# Patient Record
Sex: Female | Born: 1965 | Race: White | Hispanic: No | Marital: Married | Smoking: Current every day smoker
Health system: Southern US, Community
[De-identification: ages and names within clinical notes are randomized; demographics above are authoritative.]

## PROBLEM LIST (undated history)

## (undated) DIAGNOSIS — G4730 Sleep apnea, unspecified: Secondary | ICD-10-CM

## (undated) DIAGNOSIS — J449 Chronic obstructive pulmonary disease, unspecified: Secondary | ICD-10-CM

## (undated) DIAGNOSIS — R7303 Prediabetes: Secondary | ICD-10-CM

## (undated) DIAGNOSIS — E785 Hyperlipidemia, unspecified: Secondary | ICD-10-CM

## (undated) DIAGNOSIS — K219 Gastro-esophageal reflux disease without esophagitis: Secondary | ICD-10-CM

## (undated) DIAGNOSIS — I10 Essential (primary) hypertension: Secondary | ICD-10-CM

## (undated) DIAGNOSIS — E039 Hypothyroidism, unspecified: Secondary | ICD-10-CM

## (undated) DIAGNOSIS — G8929 Other chronic pain: Secondary | ICD-10-CM

## (undated) DIAGNOSIS — J439 Emphysema, unspecified: Secondary | ICD-10-CM

## (undated) DIAGNOSIS — M549 Dorsalgia, unspecified: Secondary | ICD-10-CM

## (undated) HISTORY — PX: CARPAL TUNNEL RELEASE: SHX101

## (undated) HISTORY — DX: Chronic obstructive pulmonary disease, unspecified: J44.9

## (undated) HISTORY — PX: WISDOM TOOTH EXTRACTION: SHX21

## (undated) HISTORY — DX: Emphysema, unspecified: J43.9

## (undated) HISTORY — DX: Hyperlipidemia, unspecified: E78.5

---

## 1999-11-28 ENCOUNTER — Other Ambulatory Visit: Admission: RE | Admit: 1999-11-28 | Discharge: 1999-11-28 | Payer: Self-pay | Admitting: Family Medicine

## 2002-02-02 ENCOUNTER — Other Ambulatory Visit: Admission: RE | Admit: 2002-02-02 | Discharge: 2002-02-02 | Payer: Self-pay | Admitting: Family Medicine

## 2003-12-27 ENCOUNTER — Other Ambulatory Visit: Admission: RE | Admit: 2003-12-27 | Discharge: 2003-12-27 | Payer: Self-pay | Admitting: Obstetrics and Gynecology

## 2004-01-02 ENCOUNTER — Encounter: Admission: RE | Admit: 2004-01-02 | Discharge: 2004-01-02 | Payer: Self-pay | Admitting: Obstetrics & Gynecology

## 2004-07-04 ENCOUNTER — Encounter: Admission: RE | Admit: 2004-07-04 | Discharge: 2004-07-04 | Payer: Self-pay | Admitting: Obstetrics & Gynecology

## 2005-01-06 ENCOUNTER — Other Ambulatory Visit: Admission: RE | Admit: 2005-01-06 | Discharge: 2005-01-06 | Payer: Self-pay | Admitting: Obstetrics & Gynecology

## 2005-01-08 ENCOUNTER — Encounter: Admission: RE | Admit: 2005-01-08 | Discharge: 2005-01-08 | Payer: Self-pay | Admitting: Obstetrics & Gynecology

## 2007-02-18 ENCOUNTER — Encounter: Admission: RE | Admit: 2007-02-18 | Discharge: 2007-02-18 | Payer: Self-pay | Admitting: Obstetrics and Gynecology

## 2008-03-20 ENCOUNTER — Encounter: Admission: RE | Admit: 2008-03-20 | Discharge: 2008-03-20 | Payer: Self-pay | Admitting: Obstetrics & Gynecology

## 2009-09-14 ENCOUNTER — Ambulatory Visit: Payer: Self-pay | Admitting: Diagnostic Radiology

## 2009-09-14 ENCOUNTER — Emergency Department (HOSPITAL_BASED_OUTPATIENT_CLINIC_OR_DEPARTMENT_OTHER): Admission: EM | Admit: 2009-09-14 | Discharge: 2009-09-14 | Payer: Self-pay | Admitting: Emergency Medicine

## 2010-04-03 ENCOUNTER — Encounter: Admission: RE | Admit: 2010-04-03 | Discharge: 2010-04-03 | Payer: Self-pay | Admitting: Obstetrics & Gynecology

## 2010-07-07 ENCOUNTER — Encounter: Payer: Self-pay | Admitting: Obstetrics & Gynecology

## 2010-09-04 LAB — CBC
MCHC: 34.7 g/dL (ref 30.0–36.0)
MCV: 87.9 fL (ref 78.0–100.0)
Platelets: 319 10*3/uL (ref 150–400)
RDW: 11.8 % (ref 11.5–15.5)

## 2010-09-04 LAB — DIFFERENTIAL
Eosinophils Absolute: 0.2 10*3/uL (ref 0.0–0.7)
Eosinophils Relative: 2 % (ref 0–5)
Lymphocytes Relative: 31 % (ref 12–46)
Lymphs Abs: 3.6 10*3/uL (ref 0.7–4.0)
Monocytes Relative: 7 % (ref 3–12)
Neutrophils Relative %: 58 % (ref 43–77)

## 2010-09-04 LAB — URINALYSIS, ROUTINE W REFLEX MICROSCOPIC
Bilirubin Urine: NEGATIVE
Nitrite: NEGATIVE
Specific Gravity, Urine: 1.006 (ref 1.005–1.030)
pH: 6 (ref 5.0–8.0)

## 2010-09-04 LAB — COMPREHENSIVE METABOLIC PANEL
ALT: 40 U/L — ABNORMAL HIGH (ref 0–35)
AST: 33 U/L (ref 0–37)
Calcium: 10 mg/dL (ref 8.4–10.5)
Creatinine, Ser: 0.9 mg/dL (ref 0.4–1.2)
GFR calc Af Amer: >60 mL/min (ref >60)
Sodium: 141 mEq/L (ref 135–145)
Total Protein: 7.5 g/dL (ref 6.0–8.3)

## 2010-09-04 LAB — URINE MICROSCOPIC-ADD ON

## 2010-09-04 LAB — LIPASE, BLOOD: Lipase: 143 U/L (ref 23–300)

## 2011-02-05 ENCOUNTER — Other Ambulatory Visit: Payer: Self-pay | Admitting: Obstetrics & Gynecology

## 2011-02-05 DIAGNOSIS — Z1231 Encounter for screening mammogram for malignant neoplasm of breast: Secondary | ICD-10-CM

## 2011-04-10 ENCOUNTER — Ambulatory Visit
Admission: RE | Admit: 2011-04-10 | Discharge: 2011-04-10 | Disposition: A | Discharge status: Home / Self Care | Payer: 59 | Source: Ambulatory Visit | Attending: Obstetrics & Gynecology | Admitting: Obstetrics & Gynecology

## 2011-04-10 DIAGNOSIS — Z1231 Encounter for screening mammogram for malignant neoplasm of breast: Secondary | ICD-10-CM

## 2013-03-30 ENCOUNTER — Other Ambulatory Visit: Payer: Self-pay

## 2013-03-30 DIAGNOSIS — Z1231 Encounter for screening mammogram for malignant neoplasm of breast: Secondary | ICD-10-CM

## 2013-04-26 ENCOUNTER — Ambulatory Visit
Admission: RE | Admit: 2013-04-26 | Discharge: 2013-04-26 | Disposition: A | Discharge status: Home / Self Care | Payer: 59 | Source: Ambulatory Visit

## 2013-04-26 DIAGNOSIS — Z1231 Encounter for screening mammogram for malignant neoplasm of breast: Secondary | ICD-10-CM

## 2014-11-30 ENCOUNTER — Other Ambulatory Visit: Payer: Self-pay

## 2014-11-30 DIAGNOSIS — Z1231 Encounter for screening mammogram for malignant neoplasm of breast: Secondary | ICD-10-CM

## 2015-03-26 ENCOUNTER — Ambulatory Visit
Admission: RE | Admit: 2015-03-26 | Discharge: 2015-03-26 | Disposition: A | Discharge status: Home / Self Care | Payer: 59 | Source: Ambulatory Visit

## 2015-03-26 DIAGNOSIS — Z1231 Encounter for screening mammogram for malignant neoplasm of breast: Secondary | ICD-10-CM

## 2015-04-17 ENCOUNTER — Ambulatory Visit (INDEPENDENT_AMBULATORY_CARE_PROVIDER_SITE_OTHER): Payer: 59 | Admitting: Podiatry

## 2015-04-17 ENCOUNTER — Ambulatory Visit (INDEPENDENT_AMBULATORY_CARE_PROVIDER_SITE_OTHER): Payer: 59

## 2015-04-17 ENCOUNTER — Encounter: Payer: Self-pay | Admitting: Podiatry

## 2015-04-17 VITALS — BP 110/80 | HR 88 | Resp 16

## 2015-04-17 DIAGNOSIS — M722 Plantar fascial fibromatosis: Secondary | ICD-10-CM | POA: Diagnosis not present

## 2015-04-17 MED ORDER — MELOXICAM 15 MG PO TABS: 15.0000 mg | ORAL_TABLET | Freq: Every day | ORAL | Status: DC

## 2015-04-17 MED ORDER — METHYLPREDNISOLONE 4 MG PO TBPK: ORAL_TABLET | ORAL | Status: DC

## 2015-04-17 NOTE — Patient Instructions (Signed)

## 2015-04-17 NOTE — Progress Notes (Signed)
   Subjective:    Patient ID: Kristin Bender, female    DOB: 1965-08-13, 49 y.o.   MRN: [id number]  HPI: She presents today with a chief complaint of painful stabbing heel pain times the past several months. She states that it really doesn't hurt in the morning but by the end of the day is quite severe. She's tried nothing to alleviate symptoms.    Review of Systems  Musculoskeletal: Positive for gait problem.  All other systems reviewed and are negative.      Objective:   Physical Exam: 49 year old female #stable alert and oriented 3 in no apparent distress. Pulses are strongly palpable. Neurologic sensorium is intact percent was a monofilament. Deep tendon reflexes are intact bilateral and muscle strength is 5 over 5 dorsiflexion plantar flexors and inverters and everters all to the musculature is intact. Orthopedic evaluation and strength all joints distal to the ankle range of motion without crepitation. Cutaneous evaluation demonstrates supple well-hydrated cutis no erythema edema cellulitis drainage or odor. Positive pain on palpation medial calcaneal tubercle of the left heel. Radiographs confirm soft tissue increase in density at the plantar fascial calcaneal insertion site.        Assessment & Plan:  Assessment: Plantar fasciitis left.  Plan: Discussed etiology pathology conservative versus surgical therapies started her on a Medrol Dosepak to be followed by meloxicam. Injected the left heel today with log and local anesthetic. I placed her in a plantar fascial brace and the night splint. We discussed appropriate shoe gear stretching exercises ice therapy issue here modifications. I will follow-up with her in 1 month.  Roselind Messier DPM

## 2015-04-24 ENCOUNTER — Ambulatory Visit: Payer: 59 | Admitting: Podiatry

## 2015-05-16 ENCOUNTER — Institutional Professional Consult (permissible substitution): Payer: 59 | Admitting: Pulmonary Disease

## 2015-05-22 ENCOUNTER — Encounter: Payer: Self-pay | Admitting: Podiatry

## 2015-05-22 ENCOUNTER — Ambulatory Visit (INDEPENDENT_AMBULATORY_CARE_PROVIDER_SITE_OTHER): Payer: 59 | Admitting: Podiatry

## 2015-05-22 ENCOUNTER — Telehealth: Payer: Self-pay | Admitting: Pulmonary Disease

## 2015-05-22 ENCOUNTER — Telehealth: Payer: Self-pay | Admitting: *Deleted

## 2015-05-22 VITALS — BP 118/66 | HR 71 | Resp 16

## 2015-05-22 DIAGNOSIS — M722 Plantar fascial fibromatosis: Secondary | ICD-10-CM | POA: Diagnosis not present

## 2015-05-22 NOTE — Progress Notes (Signed)
She presents today for follow-up of plantar fasciitis to her left heel. She states she only takes the medication when she needs it.  Objective: Vital signs stable alert and oriented 3. Pulses are strongly palpable. Minimal pain on palpation medial calcaneal tubercle of the left heel.  Assessment: Plantar fasciitis left heel.  Plan: Continue all conservative therapies medication braces night splint shoe gear changes and follow-up with me in 1 month.

## 2015-05-22 NOTE — Telephone Encounter (Signed)
-----   Message from Glean Hess, [place] sent at 05/18/2015  9:24 AM EST ----- Regarding: APPT LIST Patient needs OV when RA opens clinic per RA

## 2015-05-22 NOTE — Telephone Encounter (Signed)
See TE dated 05/22/15.. Closing encounter

## 2015-05-22 NOTE — Telephone Encounter (Signed)
Patient scheduled to see RA tomorrow at 2:45pm. Patient aware of appointment. Nothing further needed. Closing encounter

## 2015-05-22 NOTE — Telephone Encounter (Signed)
Sharyn Lull have you called this patient?

## 2015-05-22 NOTE — Telephone Encounter (Signed)
Attempted to contact patient to schedule appointment off of Reminder List. Left message for patient to call back.

## 2015-05-23 ENCOUNTER — Encounter: Payer: Self-pay | Admitting: Pulmonary Disease

## 2015-05-23 ENCOUNTER — Ambulatory Visit (INDEPENDENT_AMBULATORY_CARE_PROVIDER_SITE_OTHER): Payer: 59 | Admitting: Pulmonary Disease

## 2015-05-23 VITALS — BP 100/64 | HR 82 | Ht 62.5 in | Wt 255.2 lb

## 2015-05-23 DIAGNOSIS — G4733 Obstructive sleep apnea (adult) (pediatric): Secondary | ICD-10-CM

## 2015-05-23 DIAGNOSIS — F17200 Nicotine dependence, unspecified, uncomplicated: Secondary | ICD-10-CM

## 2015-05-23 MED ORDER — VARENICLINE TARTRATE 0.5 MG PO TABS: 0.5000 mg | ORAL_TABLET | Freq: Two times a day (BID) | ORAL | Status: DC

## 2015-05-23 NOTE — Progress Notes (Signed)
   Subjective:    Patient ID: Kristin Bender, female    DOB: 13-Oct-1965, 49 y.o.   MRN: [id number]  HPI  Chief Complaint  Patient presents with  . Sleep Consult    Referred by Juanda Chance, NP; snoring; witnessed apnea; falling asleep at work Epworth Score: 45   49 year old obese woman presents for evaluation of sleep-disordered breathing. She reported excessive daytime fatigue and was told by her PCP that she had depression. She went on a cruise with her friend who noted loud snoring and witnessed apneas. She reports excessive daytime somnolence at work. Epworth sleepiness score is 13 Bedtime is 11 PM, sleep latency is minimal, she starts was to be on her stomach but rolls over on her back, uses one pillow, reports 2-4 nocturnal awakenings including nocturia and is out of bed at 6 AM feeling tired with dryness of mouth and occasional mild headache.  She's gained 25 pounds in the last 2 years. She reports drinking 2 cups of coffee a day and sleeps with a can of Pepsi at her bedside  She smokes about a pack per day She works a Network engineer job at [employer] She lives with her husband, no bed partner sleep history is available There is no history suggestive of cataplexy, sleep paralysis or parasomnias    No past medical history on file. Hypertension, hypothyroid No past surgical history on file.  Allergies  Allergen Reactions  . Penicillins Anaphylaxis  . Calamine   . Prozac [Fluoxetine]   . Tetracyclines & Related     Social History   Social History  . Marital Status: Divorced    Spouse Name: N/A  . Number of Children: N/A  . Years of Education: N/A   Occupational History  . Not on file.   Social History Main Topics  . Smoking status: Current Every Day Smoker  . Smokeless tobacco: Not on file  . Alcohol Use: No  . Drug Use: Not on file  . Sexual Activity: Not on file   Other Topics Concern  . Not on file   Social History Narrative    No family  history on file.  Review of Systems  Constitutional: Negative for fever, chills and unexpected weight change.  HENT: Negative for congestion, dental problem, ear pain, nosebleeds, postnasal drip, rhinorrhea, sinus pressure, sneezing, sore throat, trouble swallowing and voice change.   Eyes: Negative for visual disturbance.  Respiratory: Negative for cough, choking and shortness of breath.   Cardiovascular: Negative for chest pain and leg swelling.  Gastrointestinal: Negative for vomiting, abdominal pain and diarrhea.  Genitourinary: Negative for difficulty urinating.  Musculoskeletal: Negative for arthralgias.  Skin: Negative for rash.  Neurological: Negative for tremors, syncope and headaches.  Hematological: Does not bruise/bleed easily.       Objective:   Physical Exam  Gen. Pleasant, obese, in no distress, normal affect ENT - no lesions, no post nasal drip, class 2-3 airway Neck: No JVD, no thyromegaly, no carotid bruits Lungs: no use of accessory muscles, no dullness to percussion, decreased without rales or rhonchi  Cardiovascular: Rhythm regular, heart sounds  normal, no murmurs or gallops, no peripheral edema Abdomen: soft and non-tender, no hepatosplenomegaly, BS normal. Musculoskeletal: No deformities, no cyanosis or clubbing Neuro:  alert, non focal, no tremors        Assessment & Plan:

## 2015-05-23 NOTE — Patient Instructions (Signed)
Schedule home sleep study Rx for chantix O.5 mg twice daily x 30 x 1 refill

## 2015-05-24 DIAGNOSIS — F17200 Nicotine dependence, unspecified, uncomplicated: Secondary | ICD-10-CM | POA: Insufficient documentation

## 2015-05-24 DIAGNOSIS — G4733 Obstructive sleep apnea (adult) (pediatric): Secondary | ICD-10-CM | POA: Insufficient documentation

## 2015-05-24 NOTE — Assessment & Plan Note (Signed)
Extensive counseling. She was able to cut down with Chantix in the past. We will prescribe this again- half the usual dose seem to work for her-hence 0.5 mg twice a day

## 2015-05-24 NOTE — Assessment & Plan Note (Signed)
Given excessive daytime somnolence, narrow pharyngeal exam, witnessed apneas & loud snoring, obstructive sleep apnea is very likely & an overnight polysomnogram will be scheduled as a home study. The pathophysiology of obstructive sleep apnea , it's cardiovascular consequences & modes of treatment including CPAP were discused with the patient in detail & they evidenced understanding.  

## 2015-05-29 DIAGNOSIS — G4733 Obstructive sleep apnea (adult) (pediatric): Secondary | ICD-10-CM | POA: Diagnosis not present

## 2015-06-12 DIAGNOSIS — G4733 Obstructive sleep apnea (adult) (pediatric): Secondary | ICD-10-CM | POA: Diagnosis not present

## 2015-06-14 ENCOUNTER — Other Ambulatory Visit: Payer: Self-pay | Admitting: Pulmonary Disease

## 2015-06-14 ENCOUNTER — Encounter: Payer: Self-pay | Admitting: Pulmonary Disease

## 2015-06-14 ENCOUNTER — Other Ambulatory Visit: Payer: Self-pay | Admitting: *Deleted

## 2015-06-14 DIAGNOSIS — G4733 Obstructive sleep apnea (adult) (pediatric): Secondary | ICD-10-CM

## 2015-06-21 ENCOUNTER — Ambulatory Visit (INDEPENDENT_AMBULATORY_CARE_PROVIDER_SITE_OTHER): Payer: 59 | Admitting: Podiatry

## 2015-06-21 ENCOUNTER — Encounter: Payer: Self-pay | Admitting: Podiatry

## 2015-06-21 VITALS — BP 123/81 | HR 82 | Resp 16

## 2015-06-21 DIAGNOSIS — M722 Plantar fascial fibromatosis: Secondary | ICD-10-CM | POA: Diagnosis not present

## 2015-06-21 MED ORDER — DICLOFENAC SODIUM 75 MG PO TBEC: 75.0000 mg | DELAYED_RELEASE_TABLET | Freq: Two times a day (BID) | ORAL | Status: DC

## 2015-06-21 NOTE — Progress Notes (Signed)
She presents today for follow-up of plantar fasciitis to her left heel. She states that she was doing very good until she had a problem sleeping with the meloxicam. She states that once she discontinued the meloxicam the pain returned and she has now regressed approximately 50% of what she had gained.  Objective: Vital signs are stable she is alert and oriented 3. Pulses are strongly palpable. Neurologic sensorium is intact. She has pain on palpation medial calcaneal tubercle of the left heel.  Assessment: Chronic intractable plantar fasciitis left foot.  Plan: At this point I reinjected her left heel today with Kenalog and local anesthetic started her on diclofenac and she will continue conservative therapies. I will follow-up with her 1 month if necessary.

## 2015-07-03 ENCOUNTER — Telehealth: Payer: Self-pay | Admitting: Pulmonary Disease

## 2015-07-03 DIAGNOSIS — G4733 Obstructive sleep apnea (adult) (pediatric): Secondary | ICD-10-CM

## 2015-07-04 NOTE — Telephone Encounter (Signed)
Kristin Bender was calling to get the Home Sleep Test results. No one had called her about them and she was just being patient. When I looked up the HST in Epic only 3 pages were scanned into the chart. I reprinted the HST for Dr. Elsworth Soho to reread the study because the page that was missing was the page with all the results on it. I have called Kristin Bender and tried to explained what had happened

## 2015-07-04 NOTE — Telephone Encounter (Signed)
Kristin Bender- this encounter was opened with no msg included  Please advise if something needs to be done  Thanks!

## 2015-07-05 NOTE — Telephone Encounter (Signed)
RA please advise on pt's sleep study results. Thanks.

## 2015-07-05 NOTE — Telephone Encounter (Signed)
LMTC x `1 for pt 

## 2015-07-05 NOTE — Telephone Encounter (Signed)
Severe OSA - 34/h Needs CPAP titration

## 2015-07-05 NOTE — Telephone Encounter (Signed)
I spoke with patient about results and she verbalized understanding and had no questions. Order placed for CPAP titration study

## 2015-07-05 NOTE — Telephone Encounter (Signed)
Pt returning call and can be reached @ [phone].Kristin Bender

## 2015-07-06 DIAGNOSIS — G4733 Obstructive sleep apnea (adult) (pediatric): Secondary | ICD-10-CM | POA: Diagnosis not present

## 2015-07-09 ENCOUNTER — Ambulatory Visit (HOSPITAL_BASED_OUTPATIENT_CLINIC_OR_DEPARTMENT_OTHER): Payer: 59 | Attending: Pulmonary Disease

## 2015-07-09 VITALS — Ht 62.0 in | Wt 250.0 lb

## 2015-07-09 DIAGNOSIS — G4730 Sleep apnea, unspecified: Secondary | ICD-10-CM

## 2015-07-11 ENCOUNTER — Telehealth: Payer: Self-pay | Admitting: Pulmonary Disease

## 2015-07-11 DIAGNOSIS — G4733 Obstructive sleep apnea (adult) (pediatric): Secondary | ICD-10-CM

## 2015-07-11 NOTE — Progress Notes (Signed)
Patient Name: Kristin Bender, Kristin Bender Date: 07/09/2015 Gender: Female D.O.B: 1966/02/19 Age (years): 43 Referring Provider: Kara Mead MD, ABSM Height (inches): 62 Interpreting Physician: Kara Mead MD, ABSM Weight (lbs): 250 RPSGT: Baxter Flattery BMI: 46 MRN: [id number] Neck Size: 18.50   CLINICAL INFORMATION The patient is referred for a CPAP titration to treat sleep apnea. Date of  HST: 06/2015-severe, AHI 34/hour   SLEEP STUDY TECHNIQUE As per the AASM Manual for the Scoring of Sleep and Associated Events v2.3 (April 2016) with a hypopnea requiring 4% desaturations. The channels recorded and monitored were frontal, central and occipital EEG, electrooculogram (EOG), submentalis EMG (chin), nasal and oral airflow, thoracic and abdominal wall motion, anterior tibialis EMG, snore microphone, electrocardiogram, and pulse oximetry. Continuous positive airway pressure (CPAP) was initiated at the beginning of the study and titrated to treat sleep-disordered breathing.   MEDICATIONS Medications taken by the patient : LOSARTAN, PREVACID, SYNTHROID, DICLOFENAC  Medications administered by patient during sleep study : No sleep medicine administered.   TECHNICIAN COMMENTS Comments added by technician: Patient talked in his/her sleep.  Comments added by scorer: N/A   RESPIRATORY PARAMETERS Optimal PAP Pressure (cm): 13 AHI at Optimal Pressure (/hr): 0.0 Overall Minimal O2 (%): 71.00 Supine % at Optimal Pressure (%): 100 Minimal O2 at Optimal Pressure (%): 90.0     SLEEP ARCHITECTURE The study was initiated at 11:08:41 PM and ended at 5:11:10 AM. Sleep onset time was 6.8 minutes and the sleep efficiency was 90.3%. The total sleep time was 327.2 minutes. The patient spent 3.97% of the night in stage N1 sleep, 69.44% in stage N2 sleep, 0.00% in stage N3 and 26.59% in REM.Stage REM latency was 75.0 minutes Wake after sleep onset was 28.5. Alpha intrusion was absent. Supine sleep was  43.65%.   CARDIAC DATA The 2 lead EKG demonstrated sinus rhythm. The mean heart rate was 70.01 beats per minute. Other EKG findings include: None.   LEG MOVEMENT DATA The total Periodic Limb Movements of Sleep (PLMS) were 33. The PLMS index was 6.05. A PLMS index of <15 is considered normal in adults.   IMPRESSIONS - The optimal PAP pressure was 13 cm of water. - Central sleep apnea was not noted during this titration (CAI = 0.9/h). - Severe oxygen desaturations were observed during this titration (min O2 = 71.00%). - No snoring was audible during this study. - No cardiac abnormalities were observed during this study. - Mild periodic limb movements were observed during this study. Arousals associated with PLMs were rare.   DIAGNOSIS - Obstructive Sleep Apnea (327.23 [G47.33 ICD-10])   RECOMMENDATIONS - Trial of CPAP therapy on 13 cm H2O with a Small size Resmed Nasal Mask Airfit N20 mask and heated humidification. - Avoid alcohol, sedatives and other CNS depressants that may worsen sleep apnea and disrupt normal sleep architecture. - Sleep hygiene should be reviewed to assess factors that may improve sleep quality. - Weight management and regular exercise should be initiated or continued. - Return to Sleep Center for re-evaluation after 4 weeks of therapy  Kara Mead MD. FCCP. [facility]

## 2015-07-11 NOTE — Telephone Encounter (Signed)
CPAP terminated OSA  Send prescription for-  CPAP therapy on 13 cm H2O with a Small size Resmed Nasal Mask Airfit N20 mask and heated humidification. Download in 4 weeks  Office visit in 6

## 2015-07-12 NOTE — Telephone Encounter (Signed)
Pt made aware of results and aware that CPAP order has been sent to [facility] and they should be contacting her in the next day or so. Nothing further needed.

## 2015-07-12 NOTE — Telephone Encounter (Signed)
Order entered for CPAP. Attempted to contact patient, left message for her to return call Awaiting call back from patient.

## 2015-07-12 NOTE — Telephone Encounter (Signed)
LMTCB

## 2015-07-12 NOTE — Telephone Encounter (Signed)
Called pt to discuss home care company.  She hasn't spoken to nurse yet. Please call her back at work - [phone]. She will go to lunch from 1-2.

## 2015-07-19 ENCOUNTER — Other Ambulatory Visit: Payer: Self-pay | Admitting: Pulmonary Disease

## 2015-07-19 ENCOUNTER — Other Ambulatory Visit: Payer: Self-pay | Admitting: Podiatry

## 2015-07-20 NOTE — Telephone Encounter (Signed)
Refill request for Voltaren.  Dr. Milinda Pointer ordered pt to follow up in 1 month after the last office visit.  Refill denied.

## 2015-07-24 ENCOUNTER — Encounter: Payer: Self-pay | Admitting: Podiatry

## 2015-07-24 ENCOUNTER — Ambulatory Visit (INDEPENDENT_AMBULATORY_CARE_PROVIDER_SITE_OTHER): Payer: 59 | Admitting: Podiatry

## 2015-07-24 DIAGNOSIS — M722 Plantar fascial fibromatosis: Secondary | ICD-10-CM

## 2015-07-24 MED ORDER — DICLOFENAC SODIUM 75 MG PO TBEC: 75.0000 mg | DELAYED_RELEASE_TABLET | Freq: Two times a day (BID) | ORAL | Status: DC

## 2015-07-24 NOTE — Progress Notes (Signed)
She presents today for follow-up of her plantar fasciitis left foot. She states that it feels like him stepping on a stone sometimes but as long as a table medication and doing very well. She states that she is about 99.9% improved.  Objective: Vital signs are stable she is alert and oriented 3 pulses are palpable with no calf pain. She has no pain on palpation medial calcaneal tubercle of the left heel.  Assessment: Resolving plantar fasciitis left foot.  Plan: Continue all medications and conservative therapies for at least another month I refilled her diclofenac for her today.

## 2015-08-19 ENCOUNTER — Encounter (HOSPITAL_BASED_OUTPATIENT_CLINIC_OR_DEPARTMENT_OTHER): Payer: 59

## 2015-08-27 ENCOUNTER — Encounter: Payer: Self-pay | Admitting: Adult Health

## 2015-08-27 ENCOUNTER — Ambulatory Visit (INDEPENDENT_AMBULATORY_CARE_PROVIDER_SITE_OTHER): Payer: 59 | Admitting: Adult Health

## 2015-08-27 VITALS — BP 124/84 | HR 93 | Temp 98.5°F | Ht 62.0 in | Wt 253.0 lb

## 2015-08-27 DIAGNOSIS — G4733 Obstructive sleep apnea (adult) (pediatric): Secondary | ICD-10-CM

## 2015-08-27 NOTE — Progress Notes (Signed)
Subjective:    Patient ID: Kristin Bender, female    DOB: May 08, 1966, 50 y.o.   MRN: [id number]  HPI 50 yo female seen for Sleep consult 05/2015 . HST showed severe OSA .   TEST  Severe OSA 34/hr sleep study HST 06/2015  CPAP titration optimal control on 13cm   08/27/2015 Follow up : OSA  Pt returns for follow up for sleep apnea.  Recently dx with severe OSA on HST  Went for CPAP titration with optimal control at 13cm  She is doing well on CPAP with good results  Feels more rested  Download shows excellent compliance with avg usage at 4.5hr . AHI 0.4, some leaks.    No past medical history on file. Current Outpatient Prescriptions on File Prior to Visit  Medication Sig Dispense Refill  . CHANTIX 0.5 MG tablet TAKE 1 TABLET (0.5 MG TOTAL) BY MOUTH 2 (TWO) TIMES DAILY. 30 tablet 1  . diclofenac (VOLTAREN) 75 MG EC tablet Take 1 tablet (75 mg total) by mouth 2 (two) times daily. 60 tablet 3  . lansoprazole (PREVACID) 15 MG capsule Take 15 mg by mouth daily at 12 noon.    Marland Kitchen losartan-hydrochlorothiazide (HYZAAR) 50-12.5 MG tablet     . SYNTHROID 112 MCG tablet      No current facility-administered medications on file prior to visit.       Review of Systems Constitutional:   No  weight loss, night sweats,  Fevers, chills, fatigue, or  lassitude.  HEENT:   No headaches,  Difficulty swallowing,  Tooth/dental problems, or  Sore throat,                No sneezing, itching, ear ache, nasal congestion, post nasal drip,   CV:  No chest pain,  Orthopnea, PND, swelling in lower extremities, anasarca, dizziness, palpitations, syncope.   GI  No heartburn, indigestion, abdominal pain, nausea, vomiting, diarrhea, change in bowel habits, loss of appetite, bloody stools.   Resp: No shortness of breath with exertion or at rest.  No excess mucus, no productive cough,  No non-productive cough,  No coughing up of blood.  No change in color of mucus.  No wheezing.  No chest wall  deformity  Skin: no rash or lesions.  GU: no dysuria, change in color of urine, no urgency or frequency.  No flank pain, no hematuria   MS:  No joint pain or swelling.  No decreased range of motion.  No back pain.  Psych:  No change in mood or affect. No depression or anxiety.  No memory loss.         Objective:   Physical Exam Filed Vitals:   08/27/15 1510  BP: 124/84  Pulse: 93  Temp: 98.5 F (36.9 C)  TempSrc: Oral  Height: 5\' 2"  (1.575 m)  Weight: 253 lb (114.76 kg)  SpO2: 97%   Body mass index is 46.26 kg/(m^2).  GEN: A/Ox3; pleasant , NAD, well nourished   HEENT:  [place]/AT,  EACs-clear, TMs-wnl, NOSE-clear, THROAT-clear, no lesions, no postnasal drip or exudate noted. Class 2-3 MP airway   NECK:  Supple w/ fair ROM; no JVD; normal carotid impulses w/o bruits; no thyromegaly or nodules palpated; no lymphadenopathy.  RESP  Clear  P & A; w/o, wheezes/ rales/ or rhonchi.no accessory muscle use, no dullness to percussion  CARD:  RRR, no m/r/g  , no peripheral edema, pulses intact, no cyanosis or clubbing.  GI:   Soft & nt; nml bowel sounds;  no organomegaly or masses detected.  Musco: Warm bil, no deformities or joint swelling noted.   Neuro: alert, no focal deficits noted.    Skin: Warm, no lesions or rashes    Angelik Walls NP-C  [facility]  08/27/2015     Assessment & Plan:

## 2015-08-27 NOTE — Patient Instructions (Signed)
Wear CPAP At bedtime   Goal is to wear at least 4hr each night.  Try to lose weight .  Do not drive if sleepy.  Follow up Dr. Elsworth Soho  In 4-6 months and As needed

## 2015-08-31 NOTE — Assessment & Plan Note (Signed)
Good control   Plan  Wear CPAP At bedtime   Goal is to wear at least 4hr each night.  Try to lose weight .  Do not drive if sleepy.  Follow up Dr. Elsworth Soho  In 4-6 months and As needed

## 2015-09-03 ENCOUNTER — Institutional Professional Consult (permissible substitution): Payer: 59 | Admitting: Pulmonary Disease

## 2015-09-10 ENCOUNTER — Other Ambulatory Visit: Payer: Self-pay | Admitting: Pulmonary Disease

## 2015-09-20 ENCOUNTER — Encounter: Payer: Self-pay | Admitting: Adult Health

## 2015-11-13 ENCOUNTER — Other Ambulatory Visit: Payer: Self-pay | Admitting: Pulmonary Disease

## 2016-01-07 ENCOUNTER — Encounter: Payer: Self-pay | Admitting: Pulmonary Disease

## 2016-01-07 ENCOUNTER — Ambulatory Visit (INDEPENDENT_AMBULATORY_CARE_PROVIDER_SITE_OTHER): Payer: 59 | Admitting: Pulmonary Disease

## 2016-01-07 DIAGNOSIS — G4733 Obstructive sleep apnea (adult) (pediatric): Secondary | ICD-10-CM | POA: Diagnosis not present

## 2016-01-07 DIAGNOSIS — F17200 Nicotine dependence, unspecified, uncomplicated: Secondary | ICD-10-CM | POA: Diagnosis not present

## 2016-01-07 NOTE — Progress Notes (Signed)
   Subjective:    Patient ID: Kristin Bender, female    DOB: Jan 06, 1966, 50 y.o.   MRN: [id number]  HPI  50 yo female seen for Sleep consult 05/2015 . HST showed severe OSA .   Chief Complaint  Patient presents with  . Sleep Apnea    Doing well on CPAP, end of June had URI and was having trouble using machine due to the congestion.     She was set up with CPAP of 13 cm-she uses full facemask and alternates with a nasal mask She improved with her somnolence and fatigue and initial Download shows excellent compliance with avg usage at 4.5hr . AHI 0.4, some leaks.  However she then developed a URI with cough and had difficulty using CPAP. She is now trying to settle down with this again-nasal CPAP causes stuffiness, she occasionally sleeps on her stomach and has a leak from the side  Download again confirms good control of events and 13 cm with a large leak on occasion, compliance is more than 4 hours per night  She seems to have a lot of family stressors going on and reports some degree of insomnia due to this  Significant tests/ events  Severe OSA 34/hr sleep study HST 06/2015  CPAP titration optimal control on 13cm      Review of Systems Patient denies significant dyspnea,cough, hemoptysis,  chest pain, palpitations, pedal edema, orthopnea, paroxysmal nocturnal dyspnea, lightheadedness, nausea, vomiting, abdominal or  leg pains      Objective:   Physical Exam  Gen. Pleasant, obese, in no distress ENT - no lesions, no post nasal drip Neck: No JVD, no thyromegaly, no carotid bruits Lungs: no use of accessory muscles, no dullness to percussion, decreased without rales or rhonchi  Cardiovascular: Rhythm regular, heart sounds  normal, no murmurs or gallops, no peripheral edema Musculoskeletal: No deformities, no cyanosis or clubbing , no tremors        Assessment & Plan:

## 2016-01-07 NOTE — Assessment & Plan Note (Signed)
Smoking cessation again emphasized 

## 2016-01-07 NOTE — Assessment & Plan Note (Signed)
Changed to auto CPAP 8-13 cm Usage of at least 4 hours every night is expected  Weight loss encouraged, compliance with goal of at least 4-6 hrs every night is the expectation. Advised against medications with sedative side effects Cautioned against driving when sleepy - understanding that sleepiness will vary on a day to day basis

## 2016-01-07 NOTE — Patient Instructions (Signed)
Changed to auto CPAP 8-13 cm Usage of at least 4 hours every night is expected

## 2016-01-07 NOTE — Addendum Note (Signed)
Addended by: Mathis Dad on: 01/07/2016 03:22 PM   Modules accepted: Orders

## 2016-01-14 ENCOUNTER — Encounter: Payer: Self-pay | Admitting: Pulmonary Disease

## 2016-02-25 ENCOUNTER — Other Ambulatory Visit: Payer: Self-pay | Admitting: Obstetrics and Gynecology

## 2016-02-25 DIAGNOSIS — Z1231 Encounter for screening mammogram for malignant neoplasm of breast: Secondary | ICD-10-CM

## 2016-04-07 ENCOUNTER — Ambulatory Visit
Admission: RE | Admit: 2016-04-07 | Discharge: 2016-04-07 | Disposition: A | Discharge status: Home / Self Care | Source: Ambulatory Visit | Attending: Obstetrics and Gynecology | Admitting: Obstetrics and Gynecology

## 2016-04-07 DIAGNOSIS — Z1231 Encounter for screening mammogram for malignant neoplasm of breast: Secondary | ICD-10-CM

## 2016-06-20 DIAGNOSIS — G4733 Obstructive sleep apnea (adult) (pediatric): Secondary | ICD-10-CM | POA: Diagnosis not present

## 2016-07-10 ENCOUNTER — Encounter: Payer: Self-pay | Admitting: Adult Health

## 2016-07-11 ENCOUNTER — Ambulatory Visit: Payer: 59 | Admitting: Adult Health

## 2016-07-14 ENCOUNTER — Encounter: Payer: Self-pay | Admitting: Adult Health

## 2016-07-14 ENCOUNTER — Ambulatory Visit (INDEPENDENT_AMBULATORY_CARE_PROVIDER_SITE_OTHER): Payer: 59 | Admitting: Adult Health

## 2016-07-14 DIAGNOSIS — G4733 Obstructive sleep apnea (adult) (pediatric): Secondary | ICD-10-CM | POA: Diagnosis not present

## 2016-07-14 NOTE — Progress Notes (Signed)
Opened in Error.

## 2016-07-14 NOTE — Patient Instructions (Signed)
Wear CPAP At bedtime   Goal is to wear at least 4hr each night.  Try to lose weight .  Do not drive if sleepy.  Follow up Dr. Elsworth Soho  In 1 year and As needed

## 2016-07-14 NOTE — Progress Notes (Signed)
@Patient  ID: Kristin Bender, female    DOB: 30-Jul-1965, 51 y.o.   MRN: [id number]  Chief Complaint  Patient presents with  . Follow-up    OSA     Referring provider: Leighton Ruff, MD  HPI: HPI 75- yo female seen for Sleep consult 05/2015 . HST showed severe OSA .   TEST  Severe OSA 34/hr sleep study HST 06/2015  CPAP titration optimal control on 13cm   07/14/2016 Follow up: OSA  Pt returns for a 6 month follow up for severe OSA on CPAP  Says she is doing well on CPAP . Wears each night for 5hr .  Feels rested. W/ no sign daytime sleepiness.  Download shows excellent compliance with avg usage at 5h.  AHI 0.7 . Min leaks. She is on auto set 8-13 cmH20 (likes auto set much better) .      Allergies  Allergen Reactions  . Penicillins Anaphylaxis  . Calamine   . Prozac [Fluoxetine]   . Tetracyclines & Related      There is no immunization history on file for this patient.  No past medical history on file.  Tobacco History: History  Smoking Status  . Current Every Day Smoker  Smokeless Tobacco  . Never Used   Ready to quit: No Counseling given: Yes   Outpatient Encounter Prescriptions as of 07/14/2016  Medication Sig  . diclofenac (VOLTAREN) 75 MG EC tablet Take 1 tablet (75 mg total) by mouth 2 (two) times daily.  . lansoprazole (PREVACID) 15 MG capsule Take 15 mg by mouth daily at 12 noon.  Marland Kitchen losartan-hydrochlorothiazide (HYZAAR) 50-12.5 MG tablet Take 1 tablet by mouth daily.   Marland Kitchen SYNTHROID 112 MCG tablet Take 125 mcg by mouth daily before breakfast.   . CHANTIX 0.5 MG tablet TAKE 1 TABLET (0.5 MG TOTAL) BY MOUTH 2 (TWO) TIMES DAILY. (Patient not taking: Reported on 07/14/2016)   No facility-administered encounter medications on file as of 07/14/2016.      Review of Systems  Constitutional:   No  weight loss, night sweats,  Fevers, chills, fatigue, or  lassitude.  HEENT:   No headaches,  Difficulty swallowing,  Tooth/dental problems, or  Sore  throat,                No sneezing, itching, ear ache, nasal congestion, post nasal drip,   CV:  No chest pain,  Orthopnea, PND, swelling in lower extremities, anasarca, dizziness, palpitations, syncope.   GI  No heartburn, indigestion, abdominal pain, nausea, vomiting, diarrhea, change in bowel habits, loss of appetite, bloody stools.   Resp: No shortness of breath with exertion or at rest.  No excess mucus, no productive cough,  No non-productive cough,  No coughing up of blood.  No change in color of mucus.  No wheezing.  No chest wall deformity  Skin: no rash or lesions.  GU: no dysuria, change in color of urine, no urgency or frequency.  No flank pain, no hematuria   MS:  No joint pain or swelling.  No decreased range of motion.  No back pain.    Physical Exam  BP 110/60   Pulse 80   Ht 5\' 3"  (1.6 m)   Wt 244 lb (110.7 kg)   SpO2 95%   BMI 43.22 kg/m   GEN: A/Ox3; pleasant , NAD, well nourished    HEENT:  [place]/AT,  EACs-clear, TMs-wnl, NOSE-clear, THROAT-clear, no lesions, no postnasal drip or exudate noted. Class 2-3 MP   NECK:  Supple w/ fair ROM; no JVD; normal carotid impulses w/o bruits; no thyromegaly or nodules palpated; no lymphadenopathy.    RESP  Clear  P & A; w/o, wheezes/ rales/ or rhonchi. no accessory muscle use, no dullness to percussion  CARD:  RRR, no m/r/g, no peripheral edema, pulses intact, no cyanosis or clubbing.  GI:   Soft & nt; nml bowel sounds; no organomegaly or masses detected.   Musco: Warm bil, no deformities or joint swelling noted.   Neuro: alert, no focal deficits noted.    Skin: Warm, no lesions or rashes  Psych:  No change in mood or affect. No depression or anxiety.  No memory loss.  Lab Results:  Assessment & Plan:   OSA (obstructive sleep apnea) Well controlled on CPAP   Plan  Patient Instructions  Wear CPAP At bedtime   Goal is to wear at least 4hr each night.  Try to lose weight .  Do not drive if sleepy.  Follow  up Dr. Elsworth Soho  In 1 year and As needed       Obesity, morbid [facility]) Wt loss      Rexene Edison, NP 07/14/2016

## 2016-07-14 NOTE — Assessment & Plan Note (Signed)
Wt loss  

## 2016-07-14 NOTE — Assessment & Plan Note (Signed)
Well controlled on CPAP   Plan  Patient Instructions  Wear CPAP At bedtime   Goal is to wear at least 4hr each night.  Try to lose weight .  Do not drive if sleepy.  Follow up Dr. Elsworth Soho  In 1 year and As needed

## 2016-07-15 NOTE — Progress Notes (Signed)
Reviewed & agree with plan  

## 2016-09-19 DIAGNOSIS — G4733 Obstructive sleep apnea (adult) (pediatric): Secondary | ICD-10-CM | POA: Diagnosis not present

## 2016-09-23 DIAGNOSIS — K219 Gastro-esophageal reflux disease without esophagitis: Secondary | ICD-10-CM | POA: Diagnosis not present

## 2016-09-23 DIAGNOSIS — Z1211 Encounter for screening for malignant neoplasm of colon: Secondary | ICD-10-CM | POA: Diagnosis not present

## 2016-09-23 DIAGNOSIS — R110 Nausea: Secondary | ICD-10-CM | POA: Diagnosis not present

## 2016-09-24 ENCOUNTER — Other Ambulatory Visit: Payer: Self-pay | Admitting: Gastroenterology

## 2016-10-01 DIAGNOSIS — E119 Type 2 diabetes mellitus without complications: Secondary | ICD-10-CM | POA: Diagnosis not present

## 2016-10-01 DIAGNOSIS — E039 Hypothyroidism, unspecified: Secondary | ICD-10-CM | POA: Diagnosis not present

## 2016-10-01 DIAGNOSIS — E559 Vitamin D deficiency, unspecified: Secondary | ICD-10-CM | POA: Diagnosis not present

## 2016-10-01 DIAGNOSIS — I10 Essential (primary) hypertension: Secondary | ICD-10-CM | POA: Diagnosis not present

## 2016-11-04 ENCOUNTER — Encounter (HOSPITAL_COMMUNITY): Payer: Self-pay | Admitting: *Deleted

## 2016-11-06 ENCOUNTER — Ambulatory Visit (HOSPITAL_COMMUNITY): Payer: 59 | Admitting: Certified Registered Nurse Anesthetist

## 2016-11-06 ENCOUNTER — Ambulatory Visit (HOSPITAL_COMMUNITY)
Admission: RE | Admit: 2016-11-06 | Discharge: 2016-11-06 | Disposition: A | Discharge status: Home / Self Care | Payer: 59 | Source: Ambulatory Visit | Attending: Gastroenterology | Admitting: Gastroenterology

## 2016-11-06 ENCOUNTER — Encounter (HOSPITAL_COMMUNITY)
Admission: RE | Disposition: A | Discharge status: Home / Self Care | Payer: Self-pay | Source: Ambulatory Visit | Attending: Gastroenterology

## 2016-11-06 ENCOUNTER — Encounter (HOSPITAL_COMMUNITY): Payer: Self-pay | Admitting: *Deleted

## 2016-11-06 DIAGNOSIS — R7303 Prediabetes: Secondary | ICD-10-CM | POA: Diagnosis not present

## 2016-11-06 DIAGNOSIS — G4730 Sleep apnea, unspecified: Secondary | ICD-10-CM | POA: Diagnosis not present

## 2016-11-06 DIAGNOSIS — F17210 Nicotine dependence, cigarettes, uncomplicated: Secondary | ICD-10-CM | POA: Diagnosis not present

## 2016-11-06 DIAGNOSIS — E039 Hypothyroidism, unspecified: Secondary | ICD-10-CM | POA: Diagnosis not present

## 2016-11-06 DIAGNOSIS — K219 Gastro-esophageal reflux disease without esophagitis: Secondary | ICD-10-CM | POA: Diagnosis not present

## 2016-11-06 DIAGNOSIS — Z1211 Encounter for screening for malignant neoplasm of colon: Secondary | ICD-10-CM | POA: Insufficient documentation

## 2016-11-06 DIAGNOSIS — K648 Other hemorrhoids: Secondary | ICD-10-CM | POA: Insufficient documentation

## 2016-11-06 DIAGNOSIS — Z6841 Body Mass Index (BMI) 40.0 and over, adult: Secondary | ICD-10-CM | POA: Insufficient documentation

## 2016-11-06 DIAGNOSIS — I10 Essential (primary) hypertension: Secondary | ICD-10-CM | POA: Diagnosis not present

## 2016-11-06 DIAGNOSIS — Z880 Allergy status to penicillin: Secondary | ICD-10-CM | POA: Insufficient documentation

## 2016-11-06 DIAGNOSIS — Z79899 Other long term (current) drug therapy: Secondary | ICD-10-CM | POA: Diagnosis not present

## 2016-11-06 HISTORY — DX: Dorsalgia, unspecified: M54.9

## 2016-11-06 HISTORY — DX: Essential (primary) hypertension: I10

## 2016-11-06 HISTORY — DX: Other chronic pain: G89.29

## 2016-11-06 HISTORY — DX: Gastro-esophageal reflux disease without esophagitis: K21.9

## 2016-11-06 HISTORY — DX: Prediabetes: R73.03

## 2016-11-06 HISTORY — DX: Sleep apnea, unspecified: G47.30

## 2016-11-06 HISTORY — DX: Hypothyroidism, unspecified: E03.9

## 2016-11-06 HISTORY — PX: COLONOSCOPY WITH PROPOFOL: SHX5780

## 2016-11-06 SURGERY — COLONOSCOPY WITH PROPOFOL
Anesthesia: Monitor Anesthesia Care

## 2016-11-06 MED ORDER — PROPOFOL 500 MG/50ML IV EMUL
INTRAVENOUS | Status: DC | PRN
  Administered 2016-11-06: 100 ug/kg/min via INTRAVENOUS

## 2016-11-06 MED ORDER — PROPOFOL 10 MG/ML IV BOLUS
INTRAVENOUS | Status: AC
Start: 2016-11-06 — End: ?
  Filled 2016-11-06: qty 40

## 2016-11-06 MED ORDER — SODIUM CHLORIDE 0.9 % IV SOLN: INTRAVENOUS | Status: DC

## 2016-11-06 MED ORDER — PROPOFOL 10 MG/ML IV BOLUS
INTRAVENOUS | Status: DC | PRN
  Administered 2016-11-06: 20 mg via INTRAVENOUS
  Administered 2016-11-06: 10 mg via INTRAVENOUS
  Administered 2016-11-06: 20 mg via INTRAVENOUS

## 2016-11-06 MED ORDER — ONDANSETRON HCL 4 MG/2ML IJ SOLN
INTRAMUSCULAR | Status: DC | PRN
  Administered 2016-11-06: 4 mg via INTRAVENOUS

## 2016-11-06 MED ORDER — ONDANSETRON HCL 4 MG/2ML IJ SOLN
INTRAMUSCULAR | Status: AC
  Filled 2016-11-06: qty 2

## 2016-11-06 MED ORDER — LACTATED RINGERS IV SOLN
INTRAVENOUS | Status: DC
  Administered 2016-11-06: 07:00:00 via INTRAVENOUS

## 2016-11-06 MED ORDER — LIDOCAINE 2% (20 MG/ML) 5 ML SYRINGE
INTRAMUSCULAR | Status: DC | PRN
  Administered 2016-11-06: 50 mg via INTRAVENOUS

## 2016-11-06 MED ORDER — LIDOCAINE 2% (20 MG/ML) 5 ML SYRINGE
INTRAMUSCULAR | Status: AC
  Filled 2016-11-06: qty 5

## 2016-11-06 SURGICAL SUPPLY — 22 items

## 2016-11-06 NOTE — Anesthesia Postprocedure Evaluation (Signed)
Anesthesia Post Note  Patient: Kristin Bender  Procedure(s) Performed: Procedure(s) (LRB): COLONOSCOPY WITH PROPOFOL (N/A)  Patient location during evaluation: PACU Anesthesia Type: MAC Level of consciousness: awake and alert Pain management: pain level controlled Vital Signs Assessment: post-procedure vital signs reviewed and stable Respiratory status: spontaneous breathing, nonlabored ventilation, respiratory function stable and patient connected to nasal cannula oxygen Cardiovascular status: stable and blood pressure returned to baseline Anesthetic complications: no       Last Vitals:  Vitals:   11/06/16 0800 11/06/16 0820  BP: (!) 93/54 108/70  Pulse:    Resp:    Temp:      Last Pain:  Vitals:   11/06/16 0754  TempSrc: Oral                 Georgianne Gritz S

## 2016-11-06 NOTE — H&P (Signed)
Kristin Bender is an 51 y.o. female.   Chief Complaint: Colorectal cancer screening. HPI: 51 year old white female here for colorectal cancer screening. See office notes for details.  Past Medical History:  Diagnosis Date  . Chronic back pain   . GERD (gastroesophageal reflux disease)   . Hypertension   . Hypothyroidism   . Pre-diabetes   . Sleep apnea    Past Surgical History:  Procedure Laterality Date  . WISDOM TOOTH EXTRACTION     History reviewed. No pertinent family history. Social History:  reports that she has been smoking Cigarettes.  She has a 34.00 pack-year smoking history. She has never used smokeless tobacco. She reports that she drinks alcohol. She reports that she does not use drugs.  Allergies:  Allergies  Allergen Reactions  . Penicillins Anaphylaxis    Has patient had a PCN reaction causing immediate rash, facial/tongue/throat swelling, SOB or lightheadedness with hypotension: Yes Has patient had a PCN reaction causing severe rash involving mucus membranes or skin necrosis: Unknown Has patient had a PCN reaction that required hospitalization: no Has patient had a PCN reaction occurring within the last 10 years: no If all of the above answers are "NO", then may proceed with Cephalosporin use.   . Bee Venom Swelling  . Other Other (See Comments)    bandaids- causes pt to turn red   . Prozac [Fluoxetine] Hives  . Tetracyclines & Related Other (See Comments)    unknown  . Calamine Rash   Medications Prior to Admission  Medication Sig Dispense Refill  . acetaminophen (TYLENOL) 500 MG tablet Take 1,000 mg by mouth 2 (two) times daily as needed for mild pain.    . CHANTIX 0.5 MG tablet TAKE 1 TABLET (0.5 MG TOTAL) BY MOUTH 2 (TWO) TIMES DAILY. 30 tablet 1  . diclofenac (VOLTAREN) 75 MG EC tablet Take 1 tablet (75 mg total) by mouth 2 (two) times daily. 60 tablet 3  . ibuprofen (ADVIL,MOTRIN) 200 MG tablet Take 600 mg by mouth 2 (two) times daily as needed.     . lansoprazole (PREVACID) 15 MG capsule Take 15 mg by mouth daily.     Marland Kitchen levothyroxine (SYNTHROID) 125 MCG tablet Take 125 mcg by mouth daily before breakfast.    . losartan-hydrochlorothiazide (HYZAAR) 50-12.5 MG tablet Take 1 tablet by mouth daily.     Marland Kitchen nystatin ointment (MYCOSTATIN) Apply 1 application topically daily.    Marland Kitchen triamcinolone ointment (KENALOG) 0.1 % Apply 1 application topically daily.    . valACYclovir (VALTREX) 1000 MG tablet Take 1,000 mg by mouth daily as needed (herpes).     No results found for this or any previous visit (from the past 48 hour(s)). No results found.  Review of Systems  Constitutional: Negative.   HENT: Negative.   Eyes: Negative.   Respiratory: Negative.   Cardiovascular: Negative.   Gastrointestinal: Positive for heartburn.  Musculoskeletal: Positive for joint pain. Negative for back pain.  Skin: Negative.   Neurological: Negative.   Endo/Heme/Allergies: Negative.   Psychiatric/Behavioral: Negative.    Blood pressure (!) 111/53, temperature 97.7 F (36.5 C), temperature source Oral, resp. rate (!) 21, height 5\' 3"  (1.6 m), weight 110.7 kg (244 lb), SpO2 96 %. Physical Exam  Constitutional: She is oriented to person, place, and time. She appears well-developed and well-nourished.  morbidly obese  HENT:  Head: Normocephalic and atraumatic.  Eyes: Conjunctivae and EOM are normal. Pupils are equal, round, and reactive to light.  Neck: Normal range  of motion. Neck supple.  Cardiovascular: Normal rate and regular rhythm.   GI: Soft. Bowel sounds are normal.  Musculoskeletal: Normal range of motion.  Neurological: She is alert and oriented to person, place, and time.  Skin: Skin is warm and dry.  Psychiatric: She has a normal mood and affect. Her behavior is normal. Judgment and thought content normal.    Assessment/Plan Colorectal cancer screening/severe sleep apnea: proceed with a colonoscopy at this time.  Dillan Lunden, MD 11/06/2016,  7:21 AM

## 2016-11-06 NOTE — Op Note (Signed)
[facility] Patient Name: Kristin Bender Procedure Date: 11/06/2016 MRN: [id number] Attending MD: Juanita Craver , MD Date of Birth: 26-Apr-1966 CSN: [id number] Age: 51 Admit Type: Outpatient Procedure:                Screening colonoscopy. Indications:              CRC screening for colorectal malignant neoplasm Providers:                Juanita Craver, MD, Laverta Baltimore RN, RN, William Dalton, Technician, Christell Faith, CRNA Referring MD:             Juanda Chance NP, Marda Stalker, MD Medicines:                Monitored Anesthesia Care Complications:            No immediate complications. Estimated Blood Loss:     Estimated blood loss: none. Procedure:                Pre-anesthesia assessment: - Prior to the                            procedure, a history and physical was performed,                            and patient medications and allergies were                            reviewed. The patient's tolerance of previous                            anesthesia was also reviewed. The risks and                            benefits of the procedure and the sedation options                            and risks were discussed with the patient. All                            questions were answered, and informed consent was                            obtained. Prior Anticoagulants: The patient has                            taken no previous anticoagulant or antiplatelet                            agents. ASA Grade assessment: II - A patient with                            mild systemic disease. After reviewing the risks  and benefits, the patient was deemed in                            satisfactory condition to undergo the procedure.                            After obtaining informed consent, the colonoscope                            was passed under direct vision. Throughout the   procedure, the patient's blood pressure, pulse, and                            oxygen saturations were monitored continuously. The                            EC-[id number] ([id number]) scope was introduced through                            the anus and advanced to the the terminal ileum,                            with identification of the appendiceal orifice and                            IC valve. The colonoscopy was performed without                            difficulty. The patient tolerated the procedure                            well. The quality of the bowel preparation was                            good. The terminal ileum, the ileocecal valve, the                            appendiceal orifice and the rectum were                            photographed. The bowel preparation used was                            GoLYTELY. Scope In: 7:35:23 AM Scope Out: 1:54:00 AM Scope Withdrawal Time: 0 hours 6 minutes 3 seconds  Total Procedure Duration: 0 hours 10 minutes 55 seconds  Findings:      The entire examined portion of the colon appeared normal.      The terminal ileum appeared normal.      Small internal hemorrhoids were noted on retroflexion. Impression:               - The entire examined colon is normal.                           - The examined portion  of the ileum was normal.                           - Small internal hemorrhoids.                           - No specimens collected. Moderate Sedation:      MAC given. Recommendation:           - High fiber diet with augmented water consumption                            daily.                           - Continue present medications.                           - Repeat colonoscopy in 10 years for surveillance.                           - Return to GI office PRN.                           - If the patient has any abnormal GI symptoms in                            the interim, she has been advised to call the                             office ASAP for further recommendations. Procedure Code(s):        --- Professional ---                           [phone], Colonoscopy, flexible; diagnostic, including                            collection of specimen(s) by brushing or washing,                            when performed (separate procedure) Diagnosis Code(s):        --- Professional ---                           Z12.11, Encounter for screening for malignant                            neoplasm of colon CPT copyright 2016 American [employer]. All rights reserved. The codes documented in this report are preliminary and upon coder review may  be revised to meet current compliance requirements. Juanita Craver, MD Juanita Craver, MD 11/06/2016 7:58:50 AM This report has been signed electronically. Number of Addenda: 0

## 2016-11-06 NOTE — Transfer of Care (Signed)
   Immediate Anesthesia Transfer of Care Note  Patient: Kristin Bender  Procedure(s) Performed: Procedure(s): COLONOSCOPY WITH PROPOFOL (N/A)  Patient Location: PACU  Anesthesia Type:MAC  Level of Consciousness:  sedated, patient cooperative and responds to stimulation  Airway & Oxygen Therapy:Patient Spontanous Breathing and Patient connected to face mask oxgen  Post-op Assessment:  Report given to PACU RN and Post -op Vital signs reviewed and stable  Post vital signs:  Reviewed and stable  Last Vitals:  Vitals:   11/06/16 0639  BP: (!) 111/53  Resp: (!) 21  Temp: 62.2 C    Complications: No apparent anesthesia complications

## 2016-11-06 NOTE — Anesthesia Procedure Notes (Signed)
Procedure Name: MAC Date/Time: 11/06/2016 7:30 AM Performed by: West Pugh Pre-anesthesia Checklist: Patient identified, Emergency Drugs available, Suction available, Patient being monitored and Timeout performed Patient Re-evaluated:Patient Re-evaluated prior to inductionOxygen Delivery Method: Simple face mask Dental Injury: Teeth and Oropharynx as per pre-operative assessment

## 2016-11-06 NOTE — Anesthesia Preprocedure Evaluation (Signed)
Anesthesia Evaluation  Patient identified by MRN, date of birth, ID band Patient awake    Reviewed: Allergy & Precautions, NPO status , Patient's Chart, lab work & pertinent test results  Airway Mallampati: II  TM Distance: <3 FB Neck ROM: Full    Dental no notable dental hx.    Pulmonary sleep apnea , Current Smoker,    Pulmonary exam normal breath sounds clear to auscultation       Cardiovascular hypertension, Normal cardiovascular exam Rhythm:Regular Rate:Normal     Neuro/Psych negative neurological ROS  negative psych ROS   GI/Hepatic negative GI ROS, Neg liver ROS,   Endo/Other  Hypothyroidism Morbid obesity  Renal/GU negative Renal ROS  negative genitourinary   Musculoskeletal negative musculoskeletal ROS (+)   Abdominal   Peds negative pediatric ROS (+)  Hematology negative hematology ROS (+)   Anesthesia Other Findings   Reproductive/Obstetrics negative OB ROS                             Anesthesia Physical Anesthesia Plan  ASA: III  Anesthesia Plan: MAC   Post-op Pain Management:    Induction: Intravenous  Airway Management Planned: Nasal Cannula  Additional Equipment:   Intra-op Plan:   Post-operative Plan:   Informed Consent: I have reviewed the patients History and Physical, chart, labs and discussed the procedure including the risks, benefits and alternatives for the proposed anesthesia with the patient or authorized representative who has indicated his/her understanding and acceptance.   Dental advisory given  Plan Discussed with: CRNA and Surgeon  Anesthesia Plan Comments:         Anesthesia Quick Evaluation

## 2016-11-06 NOTE — Discharge Instructions (Signed)
YOU HAD AN ENDOSCOPIC PROCEDURE TODAY: Refer to the procedure report and other information in the discharge instructions given to you for any specific questions about what was found during the examination. If this information does not answer your questions, please call [facility] at [phone] to clarify.   YOU SHOULD EXPECT: Some feelings of bloating in the abdomen. Passage of more gas than usual. Walking can help get rid of the air that was put into your GI tract during the procedure and reduce the bloating. If you had a lower endoscopy (such as a colonoscopy or flexible sigmoidoscopy) you may notice spotting of blood in your stool or on the toilet paper. Some abdominal soreness may be present for a day or two, also.  DIET: Your first meal following the procedure should be a light meal and then it is ok to progress to your normal diet. A half-sandwich or bowl of soup is an example of a good first meal. Heavy or fried foods are harder to digest and may make you feel nauseous or bloated. Drink plenty of fluids but you should avoid alcoholic beverages for 24 hours. If you had an esophageal dilation, please see attached information for diet.   ACTIVITY: Your care partner should take you home directly after the procedure. You should plan to take it easy, moving slowly for the rest of the day. You can resume normal activity the day after the procedure however YOU SHOULD NOT DRIVE, use power tools, machinery or perform tasks that involve climbing or major physical exertion for 24 hours (because of the sedation medicines used during the test).   SYMPTOMS TO REPORT IMMEDIATELY: A gastroenterologist can be reached at any hour. Please call [phone]  for any of the following symptoms:  Following lower endoscopy (colonoscopy, flexible sigmoidoscopy) Excessive amounts of blood in the stool  Significant tenderness, worsening of abdominal pains  Swelling of the abdomen that is new, acute  Fever of  100 or higher  Following upper endoscopy (EGD, EUS, ERCP, esophageal dilation) Vomiting of blood or coffee ground material  New, significant abdominal pain  New, significant chest pain or pain under the shoulder blades  Painful or persistently difficult swallowing  New shortness of breath  Black, tarry-looking or red, bloody stools  FOLLOW UP:  If any biopsies were taken you will be contacted by phone or by letter within the next 1-3 weeks. Call [phone]  if you have not heard about the biopsies in 3 weeks.  Please also call with any specific questions about appointments or follow up tests. YOU HAD AN ENDOSCOPIC PROCEDURE TODAY: Refer to the procedure report and other information in the discharge instructions given to you for any specific questions about what was found during the examination. If this information does not answer your questions, please call [facility] at [phone] to clarify.   YOU SHOULD EXPECT: Some feelings of bloating in the abdomen. Passage of more gas than usual. Walking can help get rid of the air that was put into your GI tract during the procedure and reduce the bloating. If you had a lower endoscopy (such as a colonoscopy or flexible sigmoidoscopy) you may notice spotting of blood in your stool or on the toilet paper. Some abdominal soreness may be present for a day or two, also.  DIET: Your first meal following the procedure should be a light meal and then it is ok to progress to your normal diet. A half-sandwich or bowl of soup is an example   of a good first meal. Heavy or fried foods are harder to digest and may make you feel nauseous or bloated. Drink plenty of fluids but you should avoid alcoholic beverages for 24 hours. If you had an esophageal dilation, please see attached information for diet.   ACTIVITY: Your care partner should take you home directly after the procedure. You should plan to take it easy, moving slowly for the rest of the day. You  can resume normal activity the day after the procedure however YOU SHOULD NOT DRIVE, use power tools, machinery or perform tasks that involve climbing or major physical exertion for 24 hours (because of the sedation medicines used during the test).   SYMPTOMS TO REPORT IMMEDIATELY: A gastroenterologist can be reached at any hour. Please call [phone]  for any of the following symptoms:  Following lower endoscopy (colonoscopy, flexible sigmoidoscopy) Excessive amounts of blood in the stool  Significant tenderness, worsening of abdominal pains  Swelling of the abdomen that is new, acute  Fever of 100 or higher  Following upper endoscopy (EGD, EUS, ERCP, esophageal dilation) Vomiting of blood or coffee ground material  New, significant abdominal pain  New, significant chest pain or pain under the shoulder blades  Painful or persistently difficult swallowing  New shortness of breath  Black, tarry-looking or red, bloody stools  FOLLOW UP:  If any biopsies were taken you will be contacted by phone or by letter within the next 1-3 weeks. Call [phone]  if you have not heard about the biopsies in 3 weeks.  Please also call with any specific questions about appointments or follow up tests.  

## 2016-11-17 NOTE — Anesthesia Postprocedure Evaluation (Signed)
Anesthesia Post Note  Patient: Kristin Bender  Procedure(s) Performed: Procedure(s) (LRB): COLONOSCOPY WITH PROPOFOL (N/A)     Anesthesia Post Evaluation  Last Vitals:  Vitals:   11/06/16 0800 11/06/16 0820  BP: (!) 93/54 108/70  Pulse:    Resp:    Temp:      Last Pain:  Vitals:   11/06/16 0754  TempSrc: Oral                 Shayann Garbutt S

## 2016-11-17 NOTE — Addendum Note (Signed)
Addendum  created 11/17/16 1449 by Myrtie Soman, MD   Sign clinical note

## 2016-11-28 ENCOUNTER — Encounter: Payer: Self-pay | Admitting: Hematology

## 2016-11-28 ENCOUNTER — Telehealth: Payer: Self-pay | Admitting: Hematology

## 2016-11-28 NOTE — Telephone Encounter (Signed)
Appt has been scheduled for the pt to see Dr. Irene Limbo on 7/24 at 1pm. Attempted to contact the pt, phone rang and vm didn't pick up. Will mail the pt a letter and fax to the referring office.

## 2017-01-06 ENCOUNTER — Ambulatory Visit (HOSPITAL_BASED_OUTPATIENT_CLINIC_OR_DEPARTMENT_OTHER): Payer: 59 | Admitting: Hematology

## 2017-01-06 ENCOUNTER — Encounter: Payer: Self-pay | Admitting: Hematology

## 2017-01-06 ENCOUNTER — Other Ambulatory Visit (HOSPITAL_COMMUNITY)
Admission: RE | Admit: 2017-01-06 | Discharge: 2017-01-06 | Disposition: A | Discharge status: Home / Self Care | Payer: 59 | Source: Ambulatory Visit | Attending: Hematology | Admitting: Hematology

## 2017-01-06 ENCOUNTER — Telehealth: Payer: Self-pay | Admitting: Hematology

## 2017-01-06 ENCOUNTER — Ambulatory Visit (HOSPITAL_BASED_OUTPATIENT_CLINIC_OR_DEPARTMENT_OTHER): Payer: 59

## 2017-01-06 VITALS — BP 99/58 | HR 73 | Temp 98.7°F | Resp 18 | Ht 63.0 in | Wt 240.2 lb

## 2017-01-06 DIAGNOSIS — D72829 Elevated white blood cell count, unspecified: Secondary | ICD-10-CM | POA: Diagnosis not present

## 2017-01-06 DIAGNOSIS — D473 Essential (hemorrhagic) thrombocythemia: Secondary | ICD-10-CM | POA: Diagnosis not present

## 2017-01-06 LAB — CBC & DIFF AND RETIC
BASO%: 0.5 % (ref 0.0–2.0)
Basophils Absolute: 0.1 10*3/uL (ref 0.0–0.1)
EOS ABS: 0.4 10*3/uL (ref 0.0–0.5)
EOS%: 3.6 % (ref 0.0–7.0)
HCT: 36.6 % (ref 34.8–46.6)
HGB: 12.3 g/dL (ref 11.6–15.9)
IMMATURE RETIC FRACT: 4.8 % (ref 1.60–10.00)
LYMPH#: 4.5 10*3/uL — AB (ref 0.9–3.3)
LYMPH%: 38.5 % (ref 14.0–49.7)
MCH: 29.5 pg (ref 25.1–34.0)
MCHC: 33.6 g/dL (ref 31.5–36.0)
MCV: 87.8 fL (ref 79.5–101.0)
MONO#: 0.8 10*3/uL (ref 0.1–0.9)
MONO%: 7.1 % (ref 0.0–14.0)
NEUT%: 50.3 % (ref 38.4–76.8)
NEUTROS ABS: 5.9 10*3/uL (ref 1.5–6.5)
NRBC: 0 % (ref 0–0)
Platelets: 303 10*3/uL (ref 145–400)
RBC: 4.17 10*6/uL (ref 3.70–5.45)
RDW: 13.5 % (ref 11.2–14.5)
RETIC %: 0.93 % (ref 0.70–2.10)
RETIC CT ABS: 38.78 10*3/uL (ref 33.70–90.70)
WBC: 11.7 10*3/uL — AB (ref 3.9–10.3)

## 2017-01-06 LAB — COMPREHENSIVE METABOLIC PANEL
ALT: 33 U/L (ref 0–55)
AST: 28 U/L (ref 5–34)
Albumin: 3.7 g/dL (ref 3.5–5.0)
Alkaline Phosphatase: 73 U/L (ref 40–150)
Anion Gap: 9 mEq/L (ref 3–11)
BUN: 7.3 mg/dL (ref 7.0–26.0)
CHLORIDE: 104 meq/L (ref 98–109)
CO2: 27 meq/L (ref 22–29)
CREATININE: 0.9 mg/dL (ref 0.6–1.1)
Calcium: 9.9 mg/dL (ref 8.4–10.4)
EGFR: 77 mL/min/{1.73_m2} — ABNORMAL LOW (ref >90)
GLUCOSE: 87 mg/dL (ref 70–140)
Potassium: 4.4 mEq/L (ref 3.5–5.1)
SODIUM: 141 meq/L (ref 136–145)
TOTAL PROTEIN: 6.8 g/dL (ref 6.4–8.3)
Total Bilirubin: 0.39 mg/dL (ref 0.20–1.20)

## 2017-01-06 LAB — LACTATE DEHYDROGENASE: LDH: 281 U/L — ABNORMAL HIGH (ref 125–245)

## 2017-01-06 NOTE — Progress Notes (Signed)
Marland Kitchen    HEMATOLOGY/ONCOLOGY CONSULTATION NOTE  Date of Service: 01/06/2017  Patient Care Team: Leighton Ruff, MD as PCP - General (Family Medicine) Marda Stalker PA  CHIEF COMPLAINTS/PURPOSE OF CONSULTATION:  Elevated WBC count  HISTORY OF PRESENTING ILLNESS:   Kristin Bender is a wonderful 51 y.o. female who has been referred to [place] by Dr .Leighton Ruff, MD  for evaluation and management of elevated WBC count.  Patient has a history of hypothyroidism, sleep apnea on CPAP, hypertension,DM2,GERD, internal hemorrhoids, active smoker smoking 1 pack per day . She had labs with her primary care physician on 10/02/2016 for routine follow-up and the CBC showed an elevated WBC count of 14.5k with neutrophilia 7.9k lymphocytosis of 5.1k and monocytosis of 1.1k. Normal hemoglobin of 13.7 with an MCV of 89. Normal platelet count of 329k.  Review of outside labs show that she has had some chronic leukocytosis with predominantly neutrophilia that has been chronic. Her WBC count was 14.3k on 12/08/2015 with 10.5k neutrophils. WBC count of 12.1k on 01/22/2015. She also has some mild leukocytosis of 11k in 2011 on labs in our health system.  Patient did not have any focal symptoms suggestive of an infection. No fevers no chills no night sweats no unexpected weight loss. No other acute new focal symptoms. No recent use of steroids. No other obvious medications that would cause elevated WBC counts at this time.  MEDICAL HISTORY:  Past Medical History:  Diagnosis Date  . Chronic back pain   . GERD (gastroesophageal reflux disease)   . Hypertension   . Hypothyroidism   . Pre-diabetes   . Sleep apnea   Vitamin D deficiency ANA positive  SURGICAL HISTORY: Past Surgical History:  Procedure Laterality Date  . COLONOSCOPY WITH PROPOFOL N/A 11/06/2016   Procedure: COLONOSCOPY WITH PROPOFOL;  Surgeon: Juanita Craver, MD;  Location: [facility];  Service: Endoscopy;  Laterality: N/A;  .  WISDOM TOOTH EXTRACTION      SOCIAL HISTORY: Social History   Social History  . Marital status: Married    Spouse name: N/A  . Number of children: N/A  . Years of education: N/A   Occupational History  . Not on file.   Social History Main Topics  . Smoking status: Current Every Day Smoker    Packs/day: 1.00    Years: 34.00    Types: Cigarettes  . Smokeless tobacco: Never Used  . Alcohol use 0.0 oz/week     Comment: occasional  . Drug use: No  . Sexual activity: Not on file   Other Topics Concern  . Not on file   Social History Narrative  . No narrative on file  Works for [employer] in the [place] division.  FAMILY HISTORY: Family History  Problem Relation Age of Onset  . Hypertension Mother   . Stroke Father        Cause of death  . Hypertension Maternal Grandmother   . Cancer Maternal Grandfather        Prostate  . Breast cancer Paternal Grandmother   . Clotting disorder Paternal Grandmother        Cause of death  . Heart attack Paternal Grandfather     ALLERGIES:  is allergic to penicillins; bee venom; other; prozac [fluoxetine]; tetracyclines & related; and calamine.  MEDICATIONS:  Current Outpatient Prescriptions  Medication Sig Dispense Refill  . CHANTIX 0.5 MG tablet TAKE 1 TABLET (0.5 MG TOTAL) BY MOUTH 2 (TWO) TIMES DAILY. 30 tablet 1  . lansoprazole (  PREVACID) 15 MG capsule Take 15 mg by mouth daily.     Marland Kitchen levothyroxine (SYNTHROID) 125 MCG tablet Take 125 mcg by mouth daily before breakfast.    . losartan-hydrochlorothiazide (HYZAAR) 50-12.5 MG tablet Take 1 tablet by mouth daily.     Marland Kitchen nystatin ointment (MYCOSTATIN) Apply 1 application topically daily.    Marland Kitchen triamcinolone ointment (KENALOG) 0.1 % Apply 1 application topically daily.    . valACYclovir (VALTREX) 1000 MG tablet Take 1,000 mg by mouth daily as needed (herpes).     No current facility-administered medications for this visit.     REVIEW OF SYSTEMS:    10 Point review  of Systems was done is negative except as noted above.  PHYSICAL EXAMINATION: ECOG PERFORMANCE STATUS: 0 - Asymptomatic  . Vitals:   01/06/17 1327  BP: (!) 99/58  Pulse: 73  Resp: 18  Temp: 98.7 F (37.1 C)   Filed Weights   01/06/17 1327  Weight: 240 lb 3.2 oz (109 kg)   .Body mass index is 42.55 kg/m.  GENERAL:alert, in no acute distress and comfortable SKIN: no acute rashes, no significant lesions EYES: conjunctiva are pink and non-injected, sclera anicteric OROPHARYNX: MMM, no exudates, no oropharyngeal erythema or ulceration NECK: supple, no JVD LYMPH:  no palpable lymphadenopathy in the cervical, axillary or inguinal regions LUNGS: clear to auscultation b/l with normal respiratory effort HEART: regular rate & rhythm ABDOMEN:  normoactive bowel sounds , non tender, not distended.No palpable hepatosplenomegaly Extremity: no pedal edema PSYCH: alert & oriented x 3 with fluent speech NEURO: no focal motor/sensory deficits  LABORATORY DATA:  I have reviewed the data as listed  . CBC Latest Ref Rng & Units 01/06/2017 09/14/2009  WBC 3.9 - 10.3 10e3/uL 11.7(H) 11.7(H)  Hemoglobin 11.6 - 15.9 g/dL 12.3 14.2  Hematocrit 34.8 - 46.6 % 36.6 41.0  Platelets 145 - 400 10e3/uL 303 319    CBC    Component Value Date/Time   WBC 11.7 (H) 01/06/2017 1445   WBC 11.7 (H) 09/14/2009 1500   RBC 4.17 01/06/2017 1445   RBC 4.66 09/14/2009 1500   HGB 12.3 01/06/2017 1445   HCT 36.6 01/06/2017 1445   PLT 303 01/06/2017 1445   MCV 87.8 01/06/2017 1445   MCH 29.5 01/06/2017 1445   MCHC 33.6 01/06/2017 1445   MCHC 34.7 09/14/2009 1500   RDW 13.5 01/06/2017 1445   LYMPHSABS 4.5 (H) 01/06/2017 1445   MONOABS 0.8 01/06/2017 1445   EOSABS 0.4 01/06/2017 1445   BASOSABS 0.1 01/06/2017 1445     . CMP Latest Ref Rng & Units 09/14/2009  Glucose 70 - 99 mg/dL 92  BUN 6 - 23 mg/dL 9  Creatinine 0.4 - 1.2 mg/dL 0.9  Sodium 135 - 145 mEq/L 141  Potassium 3.5 - 5.1 mEq/L 3.9    Chloride 96 - 112 mEq/L 102  CO2 19 - 32 mEq/L 27  Calcium 8.4 - 10.5 mg/dL 10.0  Total Protein 6.0 - 8.3 g/dL 7.5  Total Bilirubin 0.3 - 1.2 mg/dL 0.5  Alkaline Phos 39 - 117 U/L 80  AST 0 - 37 U/L 33  ALT 0 - 35 U/L 40(H)   Component     Latest Ref Rng & Units 01/06/2017  LDH     125 - 245 U/L 281 (H)  Sed Rate     0 - 40 mm/hr 17      RADIOGRAPHIC STUDIES: I have personally reviewed the radiological images as listed and agreed with the findings in  the report. No results found.  ASSESSMENT & PLAN:   51 year old female with  #1 Chronic leukocytosis since at least 2011. Her WBC count was up to 14.5k in April 2018 but is down to 11.7k on labs today. She primarily has had some mild neutrophilia which is absent today. She also has had some borderline lymphocytosis which tends to vary and today is at 4.5k She intermittently has had some monocytosis. No fevers no chills no night sweats no focal symptoms suggestive of infection. No clinically palpable lymphadenopathy or hepatosplenomegaly with no overt evidence of lymphoproliferative disorder. Peripheral blood smear shows no increased blasts no significant increase in neutrophils are abnormal lymphocytes.  Flow cytometry shows no evidence of a clonal B or T cell population.  Overall picture is consistent with chronic leukocytosis related to smoking. She has had some mild elevation of LDH which could also be from lung inflammation and irritation.  Plan -I discussed with the patient the results of the workup and likely etiology of her chronic leukocytosis. -We discussed that her cigarette smoking is likely causing chronic inflammation leading to some mild borderline leukocytosis which has been variable and nonprogressive for more than 5 years. -She was counseled and recommended to pursue complete smoking cessation with her primary care physician to help her not only resolve her leukocytosis but also derive multiple other health  benefits. -No indication for bone marrow biopsy at this time -Continue follow-up with primary care physician and reconsult [place] if her white counts go to more than 20k or other CBC abnormalities such as anemia/polycythemia or changes in platelet counts arise.  We will see her back on an as-needed basis  All of the patients questions were answered with apparent satisfaction. The patient knows to call the clinic with any problems, questions or concerns.  I spent 40 minutes counseling the patient face to face. The total time spent in the appointment was 60 minutes and more than 50% was on counseling and direct patient cares.    Sullivan Lone MD [place] AAHIVMS [facility] Hematology/Oncology Physician [facility]  (Office):       [phone] (Work cell):  [phone] (Fax):           [phone]  01/06/2017 1:49 PM

## 2017-01-06 NOTE — Patient Instructions (Signed)
Thank you for choosing [facility] to provide your oncology and hematology care.  To afford each patient quality time with our providers, please arrive 30 minutes before your scheduled appointment time.  If you arrive late for your appointment, you may be asked to reschedule.  We strive to give you quality time with our providers, and arriving late affects you and other patients whose appointments are after yours.  If you are a no show for multiple scheduled visits, you may be dismissed from the clinic at the providers discretion.   Again, thank you for choosing [facility], our hope is that these requests will decrease the amount of time that you wait before being seen by our physicians.  ______________________________________________________________________ Should you have questions after your visit to the  Cancer Center, please contact our office at [phone] between the hours of 8:30 and 4:30 p.m.    Voicemails left after 4:30p.m will not be returned until the following business day.   For prescription refill requests, please have your pharmacy contact us directly.  Please also try to allow 48 hours for prescription requests.   Please contact the scheduling department for questions regarding scheduling.  For scheduling of procedures such as PET scans, CT scans, MRI, Ultrasound, etc please contact central scheduling at [phone].   Resources For Cancer Patients and Caregivers:  American Cancer Society:  800-227-2345  Can help patients locate various types of support and financial assistance Cancer Care: 1-800-813-HOPE (4673) Provides financial assistance, online support groups, medication/co-pay assistance.   [place] DSS:  336-641-3447 Where to apply for food stamps, Medicaid, and utility assistance Medicare Rights Center: 800-333-4114 Helps people with Medicare understand their rights and benefits, navigate the Medicare system, and secure the  quality healthcare they deserve SCAT: 336-333-6589 [place] [employer]'s shared-ride transportation service for eligible riders who have a disability that prevents them from riding the fixed route bus.   For additional information on assistance programs please contact our social worker:   Grier Hock/Abigail Elmore:  [phone] 

## 2017-01-06 NOTE — Telephone Encounter (Signed)
Scheduled appt per 7/24 los - Gave -patient AVS  0 no calender - RTC on as needed basis.

## 2017-01-07 LAB — SEDIMENTATION RATE: Sedimentation Rate-Westergren: 17 mm/hr (ref 0–40)

## 2017-01-09 LAB — FLOW CYTOMETRY

## 2017-01-28 DIAGNOSIS — G4733 Obstructive sleep apnea (adult) (pediatric): Secondary | ICD-10-CM | POA: Diagnosis not present

## 2017-05-02 DIAGNOSIS — J0101 Acute recurrent maxillary sinusitis: Secondary | ICD-10-CM | POA: Diagnosis not present

## 2017-05-02 DIAGNOSIS — J22 Unspecified acute lower respiratory infection: Secondary | ICD-10-CM | POA: Diagnosis not present

## 2017-05-02 DIAGNOSIS — H9201 Otalgia, right ear: Secondary | ICD-10-CM | POA: Diagnosis not present

## 2017-05-02 DIAGNOSIS — J209 Acute bronchitis, unspecified: Secondary | ICD-10-CM | POA: Diagnosis not present

## 2017-07-10 DIAGNOSIS — Z6841 Body Mass Index (BMI) 40.0 and over, adult: Secondary | ICD-10-CM | POA: Diagnosis not present

## 2017-07-10 DIAGNOSIS — Z01419 Encounter for gynecological examination (general) (routine) without abnormal findings: Secondary | ICD-10-CM | POA: Diagnosis not present

## 2017-07-14 ENCOUNTER — Encounter: Payer: Self-pay | Admitting: Pulmonary Disease

## 2017-07-14 ENCOUNTER — Ambulatory Visit (INDEPENDENT_AMBULATORY_CARE_PROVIDER_SITE_OTHER): Payer: 59 | Admitting: Pulmonary Disease

## 2017-07-14 VITALS — BP 140/76 | HR 102 | Ht 63.0 in | Wt 240.0 lb

## 2017-07-14 DIAGNOSIS — G4733 Obstructive sleep apnea (adult) (pediatric): Secondary | ICD-10-CM

## 2017-07-14 DIAGNOSIS — R0602 Shortness of breath: Secondary | ICD-10-CM

## 2017-07-14 DIAGNOSIS — F17200 Nicotine dependence, unspecified, uncomplicated: Secondary | ICD-10-CM | POA: Diagnosis not present

## 2017-07-14 NOTE — Assessment & Plan Note (Signed)
Weight loss encouraged, compliance with goal of at least 4-6 hrs every night is the expectation. Advised against medications with sedative side effects Cautioned against driving when sleepy - understanding that sleepiness will vary on a day to day basis  

## 2017-07-14 NOTE — Patient Instructions (Signed)
Lung function is okay. You have to stop smoking  CPAP works well when used, try to be more consistent with using

## 2017-07-14 NOTE — Progress Notes (Signed)
   Subjective:    Patient ID: Kristin Bender, female    DOB: [birth year], 52 y.o.   MRN: [id number]  HPI  20- yo smoker for FU of severe OSA .  She continues to smoke about a pack per day.  She traveled to [place] in November 2018, got a chest cold and had to go to an urgent care, oxygen level was low and needed nebs x2 she feels like she is recovered with oxygen saturation is 93% today she states that she is going through a lot of stress with her husband having surgery for pancreatic cancer And her brother being diagnosed with prostate cancer and is not willing to make a decision about quitting CPAP seems to help when she uses it  And improve her daytime somnolence and fatigue.  She tends to pull the mask off in her sleep. Download was reviewed and shows sporadic compliance, averages about 5 hours, mild leak is present and good control of events when used with average pressure of 12 cm on auto settings 8-13 She denies dryness of mouth and does not have any problems with pressure   Significant tests/ events reviewed  Spirometry 06/2017 showed ratio of 85, FEV1 of 81% and FVC of 75% suggesting mild restriction  Severe OSA 34/hr sleep study HST 06/2015  CPAP titration optimal control on 13cm    Past Medical History:  Diagnosis Date  . Chronic back pain   . GERD (gastroesophageal reflux disease)   . Hypertension   . Hypothyroidism   . Pre-diabetes   . Sleep apnea      Review of Systems neg for any significant sore throat, dysphagia, itching, sneezing, nasal congestion or excess/ purulent secretions, fever, chills, sweats, unintended wt loss, pleuritic or exertional cp, hempoptysis, orthopnea pnd or change in chronic leg swelling. Also denies presyncope, palpitations, heartburn, abdominal pain, nausea, vomiting, diarrhea or change in bowel or urinary habits, dysuria,hematuria, rash, arthralgias, visual complaints, headache, numbness weakness or ataxia.     Objective:   Physical  Exam   Gen. Pleasant, obese, in no distress ENT - no lesions, no post nasal drip Neck: No JVD, no thyromegaly, no carotid bruits Lungs: no use of accessory muscles, no dullness to percussion, decreased without rales or rhonchi  Cardiovascular: Rhythm regular, heart sounds  normal, no murmurs or gallops, no peripheral edema Musculoskeletal: No deformities, no cyanosis or clubbing , no tremors        Assessment & Plan:

## 2017-07-14 NOTE — Assessment & Plan Note (Signed)
Used spirometry results to motivate her to quit smoking. She is not willing to commit to a quit attempt

## 2017-07-20 DIAGNOSIS — Z131 Encounter for screening for diabetes mellitus: Secondary | ICD-10-CM | POA: Diagnosis not present

## 2017-07-20 DIAGNOSIS — Z13228 Encounter for screening for other metabolic disorders: Secondary | ICD-10-CM | POA: Diagnosis not present

## 2017-07-20 DIAGNOSIS — Z13820 Encounter for screening for osteoporosis: Secondary | ICD-10-CM | POA: Diagnosis not present

## 2017-07-20 DIAGNOSIS — Z13220 Encounter for screening for lipoid disorders: Secondary | ICD-10-CM | POA: Diagnosis not present

## 2017-08-21 DIAGNOSIS — E7800 Pure hypercholesterolemia, unspecified: Secondary | ICD-10-CM | POA: Diagnosis not present

## 2017-08-21 DIAGNOSIS — E119 Type 2 diabetes mellitus without complications: Secondary | ICD-10-CM | POA: Diagnosis not present

## 2017-08-21 DIAGNOSIS — E039 Hypothyroidism, unspecified: Secondary | ICD-10-CM | POA: Diagnosis not present

## 2017-08-21 DIAGNOSIS — E559 Vitamin D deficiency, unspecified: Secondary | ICD-10-CM | POA: Diagnosis not present

## 2017-11-26 DIAGNOSIS — G4733 Obstructive sleep apnea (adult) (pediatric): Secondary | ICD-10-CM | POA: Diagnosis not present

## 2018-06-02 DIAGNOSIS — G4733 Obstructive sleep apnea (adult) (pediatric): Secondary | ICD-10-CM | POA: Diagnosis not present

## 2018-06-03 DIAGNOSIS — E039 Hypothyroidism, unspecified: Secondary | ICD-10-CM | POA: Diagnosis not present

## 2018-06-03 DIAGNOSIS — E7800 Pure hypercholesterolemia, unspecified: Secondary | ICD-10-CM | POA: Diagnosis not present

## 2018-06-03 DIAGNOSIS — E119 Type 2 diabetes mellitus without complications: Secondary | ICD-10-CM | POA: Diagnosis not present

## 2018-06-03 DIAGNOSIS — E559 Vitamin D deficiency, unspecified: Secondary | ICD-10-CM | POA: Diagnosis not present

## 2018-06-14 DIAGNOSIS — R062 Wheezing: Secondary | ICD-10-CM | POA: Diagnosis not present

## 2018-06-14 DIAGNOSIS — J019 Acute sinusitis, unspecified: Secondary | ICD-10-CM | POA: Diagnosis not present

## 2018-06-14 DIAGNOSIS — I10 Essential (primary) hypertension: Secondary | ICD-10-CM | POA: Insufficient documentation

## 2018-06-14 DIAGNOSIS — J329 Chronic sinusitis, unspecified: Secondary | ICD-10-CM | POA: Insufficient documentation

## 2018-06-14 DIAGNOSIS — R0981 Nasal congestion: Secondary | ICD-10-CM | POA: Diagnosis not present

## 2018-06-14 DIAGNOSIS — E039 Hypothyroidism, unspecified: Secondary | ICD-10-CM | POA: Insufficient documentation

## 2018-07-08 ENCOUNTER — Ambulatory Visit: Payer: 59

## 2018-07-08 ENCOUNTER — Ambulatory Visit (INDEPENDENT_AMBULATORY_CARE_PROVIDER_SITE_OTHER): Payer: 59 | Admitting: Podiatry

## 2018-07-08 ENCOUNTER — Encounter: Payer: Self-pay | Admitting: Podiatry

## 2018-07-08 DIAGNOSIS — Q828 Other specified congenital malformations of skin: Secondary | ICD-10-CM

## 2018-07-08 DIAGNOSIS — M2042 Other hammer toe(s) (acquired), left foot: Secondary | ICD-10-CM

## 2018-07-08 NOTE — Progress Notes (Signed)
She presents today chief complaint of a painful fifth metatarsal phalangeal joint area with small tiny callused areas.  She states that she saw Dr. Cheryll Dessert is several years ago treated with acid.  Objective: Vital signs are stable alert oriented x3.  Pulses are palpable.  Has porokeratotic lesions to the plantar lateral aspect of the fifth metatarsals left greater than the right.  Assessment: Poor keratomas sub-fifth metatarsal head bilaterally.  Plan: At this point chemical destruction after mechanical destruction with Cantharone under occlusion we will follow-up with her as needed.

## 2018-07-16 ENCOUNTER — Encounter: Payer: Self-pay | Admitting: Adult Health

## 2018-07-16 ENCOUNTER — Ambulatory Visit (INDEPENDENT_AMBULATORY_CARE_PROVIDER_SITE_OTHER): Payer: 59 | Admitting: Adult Health

## 2018-07-16 DIAGNOSIS — F17200 Nicotine dependence, unspecified, uncomplicated: Secondary | ICD-10-CM

## 2018-07-16 DIAGNOSIS — G4733 Obstructive sleep apnea (adult) (pediatric): Secondary | ICD-10-CM | POA: Diagnosis not present

## 2018-07-16 NOTE — Progress Notes (Signed)
$'@Patient'O$  ID: Kristin Bender, female    DOB: 1966-04-15, 53 y.o.   MRN: [id number]  Chief Complaint  Patient presents with  . Follow-up    OSA     Referring provider: Leighton Ruff, MD  HPI: 53 year old female, active smoker, followed for obstructive sleep apnea   TEST/EVENTS :  Spirometry 06/2017 showed ratio of 85, FEV1 of 81% and FVC of 75% suggesting mild restriction  Severe OSA 34/hr sleep study HST 06/2015  CPAP titration optimal control on 13cm  07/16/2018 Follow up ; OSA Presents for a one-year follow-up.  Patient has severe sleep apnea.  Is on CPAP at bedtime.  Patient says she is doing okay on CPAP.  She does not sleep more than 4-5 hours . gets tired at work at times especially if she is reading on the computer. Does admit she smokes during the night , if she gets up she likes to smoke. We discussed cutting back on smoking especially during night time.  CPAP download shows good usage. On auto CPAP 8 to 13 cm H2O.  AHI 0.4.  Average daily usage around 5 hours.   Allergies  Allergen Reactions  . Penicillins Anaphylaxis    Has patient had a PCN reaction causing immediate rash, facial/tongue/throat swelling, SOB or lightheadedness with hypotension: Yes Has patient had a PCN reaction causing severe rash involving mucus membranes or skin necrosis: Unknown Has patient had a PCN reaction that required hospitalization: no Has patient had a PCN reaction occurring within the last 10 years: no If all of the above answers are "NO", then may proceed with Cephalosporin use.   . Bee Venom Swelling  . Hydrocodone-Acetaminophen     Other reaction(s): Dizziness, Vomiting  . Other Swelling and Other (See Comments)    bandaids- causes pt to turn red   . Prozac [Fluoxetine] Hives  . Tetracyclines & Related Other (See Comments)    Severe abdominal pain  . Calamine Rash     There is no immunization history on file for this patient.  Past Medical History:  Diagnosis Date   . Chronic back pain   . GERD (gastroesophageal reflux disease)   . Hypertension   . Hypothyroidism   . Pre-diabetes   . Sleep apnea     Tobacco History: Social History   Tobacco Use  Smoking Status Current Every Day Smoker  . Packs/day: 1.00  . Years: 34.00  . Pack years: 34.00  . Types: Cigarettes  Smokeless Tobacco Never Used   Ready to quit: No Counseling given: Yes   Outpatient Medications Prior to Visit  Medication Sig Dispense Refill  . lansoprazole (PREVACID) 15 MG capsule Take 15 mg by mouth daily.     Marland Kitchen levothyroxine (SYNTHROID) 125 MCG tablet Take 125 mcg by mouth daily before breakfast.    . losartan-hydrochlorothiazide (HYZAAR) 50-12.5 MG tablet Take 1 tablet by mouth daily.     Marland Kitchen nystatin ointment (MYCOSTATIN) Apply 1 application topically daily.    Marland Kitchen triamcinolone ointment (KENALOG) 0.1 % Apply 1 application topically daily.    . valACYclovir (VALTREX) 1000 MG tablet Take 1,000 mg by mouth daily as needed (herpes).    . VENTOLIN HFA 108 (90 Base) MCG/ACT inhaler INHALE 2 PUFF(S) EVERY 4 HOURS BY INHALATION ROUTE AS NEEDED FOR 30 DAYS.     No facility-administered medications prior to visit.      Review of Systems:   Constitutional:   No  weight loss, night sweats,  Fevers, chills, fatigue, or  lassitude.  HEENT:   No headaches,  Difficulty swallowing,  Tooth/dental problems, or  Sore throat,                No sneezing, itching, ear ache, nasal congestion, post nasal drip,   CV:  No chest pain,  Orthopnea, PND, swelling in lower extremities, anasarca, dizziness, palpitations, syncope.   GI  No heartburn, indigestion, abdominal pain, nausea, vomiting, diarrhea, change in bowel habits, loss of appetite, bloody stools.   Resp: No shortness of breath with exertion or at rest.  No excess mucus, no productive cough,  No non-productive cough,  No coughing up of blood.  No change in color of mucus.  No wheezing.  No chest wall deformity  Skin: no rash or  lesions.  GU: no dysuria, change in color of urine, no urgency or frequency.  No flank pain, no hematuria   MS:  No joint pain or swelling.  No decreased range of motion.  No back pain.    Physical Exam  BP 138/78 (BP Location: Left Arm, Cuff Size: Large)   Pulse 91   Ht 5' 2.5" (1.588 m)   Wt 246 lb (111.6 kg)   SpO2 99%   BMI 44.28 kg/m   GEN: A/Ox3; pleasant , NAD obese    HEENT:  [place]/AT,  EACs-clear, TMs-wnl, NOSE-clear, THROAT-clear, no lesions, no postnasal drip or exudate noted. Class 2-3 MP airway , poor dentition   NECK:  Supple w/ fair ROM; no JVD; normal carotid impulses w/o bruits; no thyromegaly or nodules palpated; no lymphadenopathy.    RESP  Clear  P & A; w/o, wheezes/ rales/ or rhonchi. no accessory muscle use, no dullness to percussion  CARD:  RRR, no m/r/g, no peripheral edema, pulses intact, no cyanosis or clubbing.  GI:   Soft & nt; nml bowel sounds; no organomegaly or masses detected.   Musco: Warm bil, no deformities or joint swelling noted.   Neuro: alert, no focal deficits noted.    Skin: Warm, no lesions or rashes    Lab Results:  CBC    Component Value Date/Time   WBC 11.7 (H) 01/06/2017 1445   WBC 11.7 (H) 09/14/2009 1500   RBC 4.17 01/06/2017 1445   RBC 4.66 09/14/2009 1500   HGB 12.3 01/06/2017 1445   HCT 36.6 01/06/2017 1445   PLT 303 01/06/2017 1445   MCV 87.8 01/06/2017 1445   MCH 29.5 01/06/2017 1445   MCHC 33.6 01/06/2017 1445   MCHC 34.7 09/14/2009 1500   RDW 13.5 01/06/2017 1445   LYMPHSABS 4.5 (H) 01/06/2017 1445   MONOABS 0.8 01/06/2017 1445   EOSABS 0.4 01/06/2017 1445   BASOSABS 0.1 01/06/2017 1445    BMET    Component Value Date/Time   NA 141 01/06/2017 1445   K 4.4 01/06/2017 1445   CL 102 09/14/2009 1500   CO2 27 01/06/2017 1445   GLUCOSE 87 01/06/2017 1445   BUN 7.3 01/06/2017 1445   CREATININE 0.9 01/06/2017 1445   CALCIUM 9.9 01/06/2017 1445   GFRNONAA >60 09/14/2009 1500   GFRAA  09/14/2009 1500     >60        The eGFR has been calculated using the MDRD equation. This calculation has not been validated in all clinical situations. eGFR's persistently <60 mL/min signify possible Chronic Kidney Disease.    BNP No results found for: BNP  ProBNP No results found for: PROBNP  Imaging: No results found.    No flowsheet data found.  No results  found for: NITRICOXIDE      Assessment & Plan:   OSA (obstructive sleep apnea) Controlled on CPAP  Need improved sleep regimen .  Trial of melatonin  Smoking cessation   Plan  Patient Instructions  Continue on CPAP at bedtime Work on healthy weight Do not drive a sleepy Trial of melatonin At bedtime  .  Work on not smoking .    Try to not smoke during the night .  Follow up with Dr. Elsworth Soho  In 1 year and As needed       Tobacco use disorder Smoking cessation   Obesity, morbid ([place]) Weight loss      Rexene Edison, NP 07/16/2018

## 2018-07-16 NOTE — Assessment & Plan Note (Signed)
-   Weight loss 

## 2018-07-16 NOTE — Patient Instructions (Addendum)
Continue on CPAP at bedtime Work on healthy weight Do not drive a sleepy Trial of melatonin At bedtime  .  Work on not smoking .    Try to not smoke during the night .  Follow up with Dr. Elsworth Soho  In 1 year and As needed

## 2018-07-16 NOTE — Assessment & Plan Note (Signed)
Smoking cessation  

## 2018-07-16 NOTE — Assessment & Plan Note (Signed)
Controlled on CPAP  Need improved sleep regimen .  Trial of melatonin  Smoking cessation   Plan  Patient Instructions  Continue on CPAP at bedtime Work on healthy weight Do not drive a sleepy Trial of melatonin At bedtime  .  Work on not smoking .    Try to not smoke during the night .  Follow up with Dr. Elsworth Soho  In 1 year and As needed

## 2019-08-15 DIAGNOSIS — K219 Gastro-esophageal reflux disease without esophagitis: Secondary | ICD-10-CM | POA: Insufficient documentation

## 2019-08-19 DIAGNOSIS — G5602 Carpal tunnel syndrome, left upper limb: Secondary | ICD-10-CM | POA: Insufficient documentation

## 2019-11-22 DIAGNOSIS — Z4789 Encounter for other orthopedic aftercare: Secondary | ICD-10-CM | POA: Insufficient documentation

## 2019-12-08 DIAGNOSIS — A6000 Herpesviral infection of urogenital system, unspecified: Secondary | ICD-10-CM | POA: Insufficient documentation

## 2019-12-08 DIAGNOSIS — E785 Hyperlipidemia, unspecified: Secondary | ICD-10-CM | POA: Insufficient documentation

## 2020-10-15 ENCOUNTER — Other Ambulatory Visit: Payer: Self-pay | Admitting: *Deleted

## 2020-10-15 DIAGNOSIS — F17210 Nicotine dependence, cigarettes, uncomplicated: Secondary | ICD-10-CM

## 2020-12-03 ENCOUNTER — Ambulatory Visit (INDEPENDENT_AMBULATORY_CARE_PROVIDER_SITE_OTHER)
Admission: RE | Admit: 2020-12-03 | Discharge: 2020-12-03 | Disposition: A | Discharge status: Home / Self Care | Payer: 59 | Source: Ambulatory Visit | Attending: Family Medicine | Admitting: Family Medicine

## 2020-12-03 ENCOUNTER — Encounter: Payer: Self-pay | Admitting: Acute Care

## 2020-12-03 ENCOUNTER — Other Ambulatory Visit: Payer: Self-pay

## 2020-12-03 ENCOUNTER — Ambulatory Visit (INDEPENDENT_AMBULATORY_CARE_PROVIDER_SITE_OTHER): Payer: 59 | Admitting: Acute Care

## 2020-12-03 VITALS — BP 130/58 | HR 104 | Temp 98.4°F | Ht 62.0 in | Wt 248.8 lb

## 2020-12-03 DIAGNOSIS — F17210 Nicotine dependence, cigarettes, uncomplicated: Secondary | ICD-10-CM | POA: Diagnosis not present

## 2020-12-03 NOTE — Progress Notes (Signed)
Shared Decision Making Visit Lung Cancer Screening Program [phone])   Eligibility: Age 55 y.o. Pack Years Smoking History Calculation 39 pack year smoking history (# packs/per year x # years smoked) Recent History of coughing up blood  no Unexplained weight loss? no ( >Than 15 pounds within the last 6 months ) Prior History Lung / other cancer no (Diagnosis within the last 5 years already requiring surveillance chest CT Scans). Smoking Status Current Smoker Former Smokers: Years since quit: NA  Quit Date: NA  Visit Components: Discussion included one or more decision making aids. yes Discussion included risk/benefits of screening. yes Discussion included potential follow up diagnostic testing for abnormal scans. yes Discussion included meaning and risk of over diagnosis. yes Discussion included meaning and risk of False Positives. yes Discussion included meaning of total radiation exposure. yes  Counseling Included: Importance of adherence to annual lung cancer LDCT screening. yes Impact of comorbidities on ability to participate in the program. yes Ability and willingness to under diagnostic treatment. yes  Smoking Cessation Counseling: Current Smokers:  Discussed importance of smoking cessation. yes Information about tobacco cessation classes and interventions provided to patient. yes Patient provided with "ticket" for LDCT Scan. yes Symptomatic Patient. no  Counseling Diagnosis Code: Tobacco Use Z72.0 Asymptomatic Patient yes  Counseling (Intermediate counseling: > three minutes counseling) U0454 Former Smokers:  Discussed the importance of maintaining cigarette abstinence. yes Diagnosis Code: Personal History of Nicotine Dependence. U98.119 Information about tobacco cessation classes and interventions provided to patient. Yes Patient provided with "ticket" for LDCT Scan. yes Written Order for Lung Cancer Screening with LDCT placed in Epic. Yes (CT Chest Lung Cancer  Screening Low Dose W/O CM) [id number] Z12.2-Screening of respiratory organs Z87.891-Personal history of nicotine dependence  I have spent 25 minutes of face to face time with Ms. Bazin discussing the risks and benefits of lung cancer screening. We viewed a power point together that explained in detail the above noted topics. We paused at intervals to allow for questions to be asked and answered to ensure understanding.We discussed that the single most powerful action that she can take to decrease her risk of developing lung cancer is to quit smoking. We discussed whether or not she is ready to commit to setting a quit date. We discussed options for tools to aid in quitting smoking including nicotine replacement therapy, non-nicotine medications, support groups, Quit Smart classes, and behavior modification. We discussed that often times setting smaller, more achievable goals, such as eliminating 1 cigarette a day for a week and then 2 cigarettes a day for a week can be helpful in slowly decreasing the number of cigarettes smoked. This allows for a sense of accomplishment as well as providing a clinical benefit. I gave her the " Be Stronger Than Your Excuses" card with contact information for community resources, classes, free nicotine replacement therapy, and access to mobile apps, text messaging, and on-line smoking cessation help. I have also given her my card and contact information in the event she needs to contact me. We discussed the time and location of the scan, and that either Doroteo Glassman RN or I will call with the results within 24-48 hours of receiving them. I have offered her  a copy of the power point we viewed  as a resource in the event they need reinforcement of the concepts we discussed today in the office. The patient verbalized understanding of all of  the above and had no further questions upon leaving the office. They have  my contact information in the event they have any further  questions.  I spent 4-5 minutes counseling on smoking cessation and the health risks of continued tobacco abuse.  I explained to the patient that there has been a high incidence of coronary artery disease noted on these exams. I explained that this is a non-gated exam therefore degree or severity cannot be determined. This patient is not on statin therapy. I have asked the patient to follow-up with their PCP regarding any incidental finding of coronary artery disease and management with diet or medication as their PCP  feels is clinically indicated. The patient verbalized understanding of the above and had no further questions upon completion of the visit.    Pt. Is not ready to quit smoking at present. I have told her to let us know when she is ready so we can help and support her through the process.   Magdalen Spatz, NP 12/03/2020

## 2020-12-03 NOTE — Patient Instructions (Signed)
Thank you for participating in the [facility]. It was our pleasure to meet you today. We will call you with the results of your scan within the next few days. Your scan will be assigned a Lung RADS category score by the physicians reading the scans.  This Lung RADS score determines follow up scanning.  See below for description of categories, and follow up screening recommendations. We will be in touch to schedule your follow up screening annually or based on recommendations of our providers. We will fax a copy of your scan results to your Primary Care Physician, or the physician who referred you to the program, to ensure they have the results. Please call the office if you have any questions or concerns regarding your scanning experience or results.  Our office number is [phone]. Please speak with Denise Phelps, RN. She is our Lung Cancer Screening RN. If she is unavailable when you call, please have the office staff send her a message. She will return your call at her earliest convenience. Remember, if your scan is normal, we will scan you annually as long as you continue to meet the criteria for the program. (Age 55-77, Current smoker or smoker who has quit within the last 15 years). If you are a smoker, remember, quitting is the single most powerful action that you can take to decrease your risk of lung cancer and other pulmonary, breathing related problems. We know quitting is hard, and we are here to help.  Please let us know if there is anything we can do to help you meet your goal of quitting. If you are a former smoker, congratulations. We are proud of you! Remain smoke free! Remember you can refer friends or family members through the number above.  We will screen them to make sure they meet criteria for the program. Thank you for helping us take better care of you by participating in Lung Screening.  Lung RADS Categories:  Lung RADS 1: no nodules  or definitely non-concerning nodules.  Recommendation is for a repeat annual scan in 12 months.  Lung RADS 2:  nodules that are non-concerning in appearance and behavior with a very low likelihood of becoming an active cancer. Recommendation is for a repeat annual scan in 12 months.  Lung RADS 3: nodules that are probably non-concerning , includes nodules with a low likelihood of becoming an active cancer.  Recommendation is for a 6-month repeat screening scan. Often noted after an upper respiratory illness. We will be in touch to make sure you have no questions, and to schedule your 6-month scan.  Lung RADS 4 A: nodules with concerning findings, recommendation is most often for a follow up scan in 3 months or additional testing based on our provider's assessment of the scan. We will be in touch to make sure you have no questions and to schedule the recommended 3 month follow up scan.  Lung RADS 4 B:  indicates findings that are concerning. We will be in touch with you to schedule additional diagnostic testing based on our provider's  assessment of the scan.   

## 2020-12-11 NOTE — Progress Notes (Signed)
Please call patient and let them  know their  low dose Ct was read as a Lung  RADS 3, nodules that are probably benign findings, short term follow up suggested: includes nodules with a low likelihood of becoming a clinically active cancer. Radiology recommends a 6 month repeat LDCT follow up. .Please let them  know we will order and schedule their  6 month screening scan for 05/2021. Please let them  know there was notation of CAD on their  scan.  Please remind the patient  that this is a non-gated exam therefore degree or severity of disease  cannot be determined. Please have them  follow up with their PCP regarding potential risk factor modification, dietary therapy or pharmacologic therapy if clinically indicated. Pt.  is  currently on statin therapy. Please place order for annual  screening scan for  12/2022and fax results to PCP. Thanks so much.

## 2020-12-12 ENCOUNTER — Other Ambulatory Visit: Payer: Self-pay | Admitting: *Deleted

## 2020-12-12 DIAGNOSIS — F17210 Nicotine dependence, cigarettes, uncomplicated: Secondary | ICD-10-CM

## 2020-12-18 ENCOUNTER — Ambulatory Visit: Payer: 59 | Admitting: Adult Health

## 2021-01-03 ENCOUNTER — Encounter: Payer: Self-pay | Admitting: Adult Health

## 2021-01-03 ENCOUNTER — Other Ambulatory Visit: Payer: Self-pay

## 2021-01-03 ENCOUNTER — Ambulatory Visit (INDEPENDENT_AMBULATORY_CARE_PROVIDER_SITE_OTHER): Payer: 59 | Admitting: Adult Health

## 2021-01-03 DIAGNOSIS — J439 Emphysema, unspecified: Secondary | ICD-10-CM

## 2021-01-03 DIAGNOSIS — G4733 Obstructive sleep apnea (adult) (pediatric): Secondary | ICD-10-CM | POA: Diagnosis not present

## 2021-01-03 DIAGNOSIS — F17200 Nicotine dependence, unspecified, uncomplicated: Secondary | ICD-10-CM | POA: Diagnosis not present

## 2021-01-03 NOTE — Progress Notes (Signed)
@Patient  ID: Kristin Bender, female    DOB: Dec 20, 1965, 55 y.o.   MRN: [id number]    Referring provider: Leighton Ruff, MD  HPI: 55 year old female active smoker followed for obstructive sleep apnea  TEST/EVENTS :  Spirometry 06/2017 showed ratio of 85, FEV1 of 81% and FVC of 75% suggesting mild restriction   Severe OSA 34/hr sleep study HST 06/2015 CPAP titration optimal control on 13cm     01/03/2021 Follow up : OSA  Patient returns for a follow-up visit.  She was last seen January 2020.  Patient is on CPAP at bedtime for sleep apnea.  Patient says she is doing well on CPAP.  She says she cannot sleep without her CPAP.  She uses her CPAP every night for about 6 to 7 hours.  CPAP download shows excellent compliance with 100% usage.  Daily average usage at 6.5 hours.  Patient is on auto CPAP 8 to 13 cm H2O.  AHI 0.2. She feels rested with no significant daytime sleepiness.  Patient is an active smoker.  Smoking cessation discussed.Participates in the low-dose CT screening program. Notable emphysema on CT scan.  Patient does get short of breath with activities.  Uses her albuterol inhaler about 2-3 times a day.  Previous spirometry showed no significant airflow obstruction. She denies any hemoptysis chest pain orthopnea PND or increased leg swelling    Allergies  Allergen Reactions   Penicillins Anaphylaxis    Has patient had a PCN reaction causing immediate rash, facial/tongue/throat swelling, SOB or lightheadedness with hypotension: Yes Has patient had a PCN reaction causing severe rash involving mucus membranes or skin necrosis: Unknown Has patient had a PCN reaction that required hospitalization: no Has patient had a PCN reaction occurring within the last 10 years: no If all of the above answers are "NO", then may proceed with Cephalosporin use.    Bee Venom Swelling   Hydrocodone-Acetaminophen     Other reaction(s): Dizziness, Vomiting   Other Swelling and Other  (See Comments)    bandaids- causes pt to turn red    Prozac [Fluoxetine] Hives   Tetracyclines & Related Other (See Comments)    Severe abdominal pain   Calamine Rash    Immunization History  Administered Date(s) Administered   PFIZER(Purple Top)SARS-COV-2 Vaccination 08/27/2019, 09/19/2019, 07/13/2020    Past Medical History:  Diagnosis Date   Chronic back pain    GERD (gastroesophageal reflux disease)    Hypertension    Hypothyroidism    Pre-diabetes    Sleep apnea     Tobacco History: Social History   Tobacco Use  Smoking Status Every Day   Packs/day: 1.00   Years: 34.00   Pack years: 34.00   Types: Cigarettes  Smokeless Tobacco Never   Ready to quit: No Counseling given: Yes   Outpatient Medications Prior to Visit  Medication Sig Dispense Refill   lansoprazole (PREVACID) 15 MG capsule Take 15 mg by mouth daily.      levothyroxine (SYNTHROID) 125 MCG tablet Take 125 mcg by mouth daily before breakfast. Takes name brand only (Synthroid)     losartan-hydrochlorothiazide (HYZAAR) 50-12.5 MG tablet Take 1 tablet by mouth daily.      nystatin ointment (MYCOSTATIN) Apply 1 application topically daily.     triamcinolone ointment (KENALOG) 0.1 % Apply 1 application topically daily.     valACYclovir (VALTREX) 1000 MG tablet Take 1,000 mg by mouth daily as needed (herpes).     VENTOLIN HFA 108 (90 Base) MCG/ACT inhaler INHALE 2  PUFF(S) EVERY 4 HOURS BY INHALATION ROUTE AS NEEDED FOR 30 DAYS.     No facility-administered medications prior to visit.     Review of Systems:   Constitutional:   No  weight loss, night sweats,  Fevers, chills, fatigue, or  lassitude.  HEENT:   No headaches,  Difficulty swallowing,  Tooth/dental problems, or  Sore throat,                No sneezing, itching, ear ache, nasal congestion, post nasal drip,   CV:  No chest pain,  Orthopnea, PND, swelling in lower extremities, anasarca, dizziness, palpitations, syncope.   GI  No heartburn,  indigestion, abdominal pain, nausea, vomiting, diarrhea, change in bowel habits, loss of appetite, bloody stools.   Resp: .  No excess mucus, no productive cough,  No non-productive cough,  No coughing up of blood.  No change in color of mucus.  No wheezing.  No chest wall deformity  Skin: no rash or lesions.  GU: no dysuria, change in color of urine, no urgency or frequency.  No flank pain, no hematuria   MS:  No joint pain or swelling.  No decreased range of motion.  No back pain.    Physical Exam  BP 124/72   Pulse 84   Ht 5\' 2"  (1.575 m)   Wt 251 lb 9.6 oz (114.1 kg)   SpO2 95%   BMI 46.02 kg/m   GEN: A/Ox3; pleasant , NAD, well nourished    HEENT:  [place]/AT,  EACs-clear, TMs-wnl, NOSE-clear, THROAT-clear, no lesions, no postnasal drip or exudate noted.   NECK:  Supple w/ fair ROM; no JVD; normal carotid impulses w/o bruits; no thyromegaly or nodules palpated; no lymphadenopathy.    RESP  Clear  P & A; w/o, wheezes/ rales/ or rhonchi. no accessory muscle use, no dullness to percussion  CARD:  RRR, no m/r/g, no peripheral edema, pulses intact, no cyanosis or clubbing.  GI:   Soft & nt; nml bowel sounds; no organomegaly or masses detected.   Musco: Warm bil, no deformities or joint swelling noted.   Neuro: alert, no focal deficits noted.    Skin: Warm, no lesions or rashes    Lab Results:     BNP No results found for: BNP  ProBNP No results found for: PROBNP  Imaging: No results found.    No flowsheet data found.  No results found for: NITRICOXIDE      Assessment & Plan:   No problem-specific Assessment & Plan notes found for this encounter.     Rexene Edison, NP 01/03/2021

## 2021-01-03 NOTE — Patient Instructions (Addendum)
Continue on CPAP at bedtime Work on healthy weight Do not drive a sleepy Work on not smoking .    Continue with LDCT screening as planned.  Albuterol inhaler as needed.  Follow up in 4-6 weeks with PFT and As needed

## 2021-01-07 DIAGNOSIS — J439 Emphysema, unspecified: Secondary | ICD-10-CM | POA: Insufficient documentation

## 2021-01-07 NOTE — Assessment & Plan Note (Signed)
Smoking cessation discussed 

## 2021-01-07 NOTE — Assessment & Plan Note (Signed)
Patient is active smoker.  Emphysema noted on CT scan.  Patient does have some shortness of breath.  We will have her return back for pulmonary function test as for an evaluation  Plan  Patient Instructions  Continue on CPAP at bedtime Work on healthy weight Do not drive a sleepy Work on not smoking .    Continue with LDCT screening as planned.  Albuterol inhaler as needed.  Follow up in 4-6 weeks with PFT and As needed

## 2021-01-07 NOTE — Assessment & Plan Note (Signed)
Excellent control and compliance on nocturnal CPAP  Plan  Patient Instructions  Continue on CPAP at bedtime Work on healthy weight Do not drive a sleepy Work on not smoking .    Continue with LDCT screening as planned.  Albuterol inhaler as needed.  Follow up in 4-6 weeks with PFT and As needed

## 2021-02-07 ENCOUNTER — Other Ambulatory Visit: Payer: Self-pay | Admitting: Pulmonary Disease

## 2021-02-07 DIAGNOSIS — J439 Emphysema, unspecified: Secondary | ICD-10-CM

## 2021-02-08 ENCOUNTER — Ambulatory Visit (INDEPENDENT_AMBULATORY_CARE_PROVIDER_SITE_OTHER): Payer: 59 | Admitting: Adult Health

## 2021-02-08 ENCOUNTER — Encounter: Payer: Self-pay | Admitting: Adult Health

## 2021-02-08 ENCOUNTER — Ambulatory Visit (INDEPENDENT_AMBULATORY_CARE_PROVIDER_SITE_OTHER): Payer: 59 | Admitting: Pulmonary Disease

## 2021-02-08 ENCOUNTER — Other Ambulatory Visit: Payer: Self-pay

## 2021-02-08 VITALS — BP 120/60 | HR 96 | Temp 98.4°F | Ht 62.0 in | Wt 248.0 lb

## 2021-02-08 DIAGNOSIS — R0600 Dyspnea, unspecified: Secondary | ICD-10-CM

## 2021-02-08 DIAGNOSIS — J439 Emphysema, unspecified: Secondary | ICD-10-CM

## 2021-02-08 DIAGNOSIS — G4733 Obstructive sleep apnea (adult) (pediatric): Secondary | ICD-10-CM | POA: Diagnosis not present

## 2021-02-08 DIAGNOSIS — F17210 Nicotine dependence, cigarettes, uncomplicated: Secondary | ICD-10-CM | POA: Diagnosis not present

## 2021-02-08 DIAGNOSIS — R0602 Shortness of breath: Secondary | ICD-10-CM | POA: Diagnosis not present

## 2021-02-08 DIAGNOSIS — F17200 Nicotine dependence, unspecified, uncomplicated: Secondary | ICD-10-CM

## 2021-02-08 DIAGNOSIS — R0609 Other forms of dyspnea: Secondary | ICD-10-CM

## 2021-02-08 LAB — PULMONARY FUNCTION TEST
DL/VA % pred: 81 %
DL/VA: 3.53 ml/min/mmHg/L
DLCO cor % pred: 83 %
DLCO cor: 16.08 ml/min/mmHg
DLCO unc % pred: 83 %
DLCO unc: 16.08 ml/min/mmHg
FEF 25-75 Post: 2.29 L/sec
FEF 25-75 Pre: 1.81 L/sec
FEF2575-%Change-Post: 26 %
FEF2575-%Pred-Post: 93 %
FEF2575-%Pred-Pre: 74 %
FEV1-%Change-Post: 5 %
FEV1-%Pred-Post: 88 %
FEV1-%Pred-Pre: 84 %
FEV1-Post: 2.22 L
FEV1-Pre: 2.1 L
FEV1FVC-%Change-Post: -2 %
FEV1FVC-%Pred-Pre: 98 %
FEV6-%Change-Post: 7 %
FEV6-%Pred-Post: 93 %
FEV6-%Pred-Pre: 87 %
FEV6-Post: 2.89 L
FEV6-Pre: 2.7 L
FEV6FVC-%Change-Post: 0 %
FEV6FVC-%Pred-Post: 102 %
FEV6FVC-%Pred-Pre: 103 %
FVC-%Change-Post: 7 %
FVC-%Pred-Post: 91 %
FVC-%Pred-Pre: 84 %
FVC-Post: 2.91 L
FVC-Pre: 2.7 L
Post FEV1/FVC ratio: 76 %
Post FEV6/FVC ratio: 99 %
Pre FEV1/FVC ratio: 78 %
Pre FEV6/FVC Ratio: 100 %
RV % pred: 133 %
RV: 2.39 L
TLC % pred: 108 %
TLC: 5.14 L

## 2021-02-08 MED ORDER — SPIRIVA RESPIMAT 2.5 MCG/ACT IN AERS: 2.0000 | INHALATION_SPRAY | Freq: Every day | RESPIRATORY_TRACT | 5 refills | Status: DC

## 2021-02-08 MED ORDER — SPIRIVA RESPIMAT 2.5 MCG/ACT IN AERS
2.0000 | INHALATION_SPRAY | Freq: Every day | RESPIRATORY_TRACT | 0 refills | Status: DC
Start: 2021-02-08 — End: 2021-04-22

## 2021-02-08 NOTE — Assessment & Plan Note (Signed)
Excellent control on nocturnal CPAP  Plan  Patient Instructions  Begin Spiriva 2 puffs. Daily.  Albuterol inhaler As needed    Continue on CPAP at bedtime Work on healthy weight Do not drive aof sleepy Work on not smoking .    Continue with LDCT screening as planned.   Flu shot this fall  2nd Covid booster.   Refer to Cardiology   Follow up in 4-6 months with Dr. Elsworth Soho  or Royal Piedra NP

## 2021-02-08 NOTE — Progress Notes (Signed)
PFT done today. 

## 2021-02-08 NOTE — Patient Instructions (Addendum)
Begin Spiriva 2 puffs. Daily.  Albuterol inhaler As needed    Continue on CPAP at bedtime Work on healthy weight Do not drive aof sleepy Work on not smoking .    Continue with LDCT screening as planned.   Flu shot this fall  2nd Covid booster.   Refer to Cardiology   Follow up in 4-6 months with Dr. Elsworth Soho  or Royal Piedra NP

## 2021-02-08 NOTE — Progress Notes (Signed)
$'@Patient'k$  ID: Kristin Bender, female    DOB: [birth year], 55 y.o.   MRN: [id number]  Chief Complaint  Patient presents with   Follow-up    Referring provider: Lenetta Quaker  HPI: 55 year old female active smoker followed for obstructive sleep apnea and Emphysema   TEST/EVENTS :  Spirometry 06/2017 showed ratio of 85, FEV1 of 81% and FVC of 75% suggesting mild restriction   Severe OSA 34/hr sleep study HST 06/2015 CPAP titration optimal control on 13cm   02/08/2021 Follow up : Emphysema  Patient presents for 1 month follow-up.  Patient is an active smoker.  She participates in the low-dose CT screening program.  CT chest noted emphysema.  She continues to smoke.  We discussed smoking cessation.  She complains of getting short of breath with activities and daily use of Albuterol.  Previous spirometry is showed no airflow obstruction.  Patient was set up for pulmonary function testing that was completed today that shows normal lung function with an FEV1 at 88%, ratio 76, FVC 91%, no significant bronchodilator response.  DLCO was 83%. We discussed her test results.  Encouraged on smoking cessation. She does have an albuterol inhaler , uses several times a week.  Patient says she gets short of breath with activities.  This has been going on for a while.  Gets occasional chest tightness with activities.  She has a strong family history of heart disease.  Mother had coronary artery disease with previous stents.  Also had valvular heart disease.  Grandmother had heart disease.  Brother and sisters all have hypertension.  Patient has hypertension and obesity.  And she is an active smoker  Patient has underlying obstructive sleep apnea.  She says she wears her CPAP every single night.  Never misses any nights.  Typically gets in about 6 to 7 hours.  She is on CPAP auto 8 to 13 cm H2O. CPAP download excellent compliance with 100% usage.  Daily average usage at 6.5 hours.  AHI 0.2       Allergies  Allergen Reactions   Penicillins Anaphylaxis    Has patient had a PCN reaction causing immediate rash, facial/tongue/throat swelling, SOB or lightheadedness with hypotension: Yes Has patient had a PCN reaction causing severe rash involving mucus membranes or skin necrosis: Unknown Has patient had a PCN reaction that required hospitalization: no Has patient had a PCN reaction occurring within the last 10 years: no If all of the above answers are "NO", then may proceed with Cephalosporin use.    Bee Venom Swelling   Hydrocodone-Acetaminophen     Other reaction(s): Dizziness, Vomiting   Other Swelling and Other (See Comments)    bandaids- causes pt to turn red    Prozac [Fluoxetine] Hives   Tetracyclines & Related Other (See Comments)    Severe abdominal pain   Calamine Rash    Immunization History  Administered Date(s) Administered   PFIZER(Purple Top)SARS-COV-2 Vaccination 08/27/2019, 09/19/2019, 07/13/2020    Past Medical History:  Diagnosis Date   Chronic back pain    GERD (gastroesophageal reflux disease)    Hypertension    Hypothyroidism    Pre-diabetes    Sleep apnea     Tobacco History: Social History   Tobacco Use  Smoking Status Every Day   Packs/day: 1.00   Years: 34.00   Pack years: 34.00   Types: Cigarettes  Smokeless Tobacco Never   Ready to quit: No Counseling given: Yes   Outpatient Medications Prior to Visit  Medication  Sig Dispense Refill   lansoprazole (PREVACID) 15 MG capsule Take 15 mg by mouth daily.      levothyroxine (SYNTHROID) 125 MCG tablet Take 125 mcg by mouth daily before breakfast. Takes name brand only (Synthroid)     losartan-hydrochlorothiazide (HYZAAR) 50-12.5 MG tablet Take 1 tablet by mouth daily.      nystatin ointment (MYCOSTATIN) Apply 1 application topically daily.     triamcinolone ointment (KENALOG) 0.1 % Apply 1 application topically daily.     valACYclovir (VALTREX) 1000 MG tablet Take 1,000 mg by  mouth daily as needed (herpes).     VENTOLIN HFA 108 (90 Base) MCG/ACT inhaler INHALE 2 PUFF(S) EVERY 4 HOURS BY INHALATION ROUTE AS NEEDED FOR 30 DAYS.     No facility-administered medications prior to visit.     Review of Systems:   Constitutional:   No  weight loss, night sweats,  Fevers, chills,  +fatigue, or  lassitude.  HEENT:   No headaches,  Difficulty swallowing,  Tooth/dental problems, or  Sore throat,                No sneezing, itching, ear ache, nasal congestion, post nasal drip,   CV:  No chest pain,  Orthopnea, PND, swelling in lower extremities, anasarca, dizziness, palpitations, syncope.   GI  No heartburn, indigestion, abdominal pain, nausea, vomiting, diarrhea, change in bowel habits, loss of appetite, bloody stools.   Resp:   No excess mucus, no productive cough,  No non-productive cough,  No coughing up of blood.  No change in color of mucus.  No wheezing.  No chest wall deformity  Skin: no rash or lesions.  GU: no dysuria, change in color of urine, no urgency or frequency.  No flank pain, no hematuria   MS:  No joint pain or swelling.  No decreased range of motion.  No back pain.    Physical Exam  BP 120/60 (BP Location: Left Arm, Patient Position: Sitting, Cuff Size: Large)   Pulse 96   Temp 98.4 F (36.9 C) (Oral)   Ht '5\' 2"'$  (1.575 m)   Wt 248 lb (112.5 kg)   SpO2 96%   BMI 45.36 kg/m   GEN: A/Ox3; pleasant , NAD, well nourished    HEENT:  [place]/AT,  EACs-clear, TMs-wnl, NOSE-clear, THROAT-clear, no lesions, no postnasal drip or exudate noted.   NECK:  Supple w/ fair ROM; no JVD; normal carotid impulses w/o bruits; no thyromegaly or nodules palpated; no lymphadenopathy.    RESP  Clear  P & A; w/o, wheezes/ rales/ or rhonchi. no accessory muscle use, no dullness to percussion  CARD:  RRR, no m/r/g, no peripheral edema, pulses intact, no cyanosis or clubbing.  GI:   Soft & nt; nml bowel sounds; no organomegaly or masses detected.   Musco: Warm  bil, no deformities or joint swelling noted.   Neuro: alert, no focal deficits noted.    Skin: Warm, no lesions or rashes    Lab Results:      BNP No results found for: BNP  ProBNP No results found for: PROBNP  Imaging: No results found.    PFT Results Latest Ref Rng & Units 02/08/2021  FVC-Pre L 2.70  FVC-Predicted Pre % 84  FVC-Post L 2.91  FVC-Predicted Post % 91  Pre FEV1/FVC % % 78  Post FEV1/FCV % % 76  FEV1-Pre L 2.10  FEV1-Predicted Pre % 84  FEV1-Post L 2.22  DLCO uncorrected ml/min/mmHg 16.08  DLCO UNC% % 83  DLCO  corrected ml/min/mmHg 16.08  DLCO COR %Predicted % 83  DLVA Predicted % 81  TLC L 5.14  TLC % Predicted % 108  RV % Predicted % 133    No results found for: NITRICOXIDE      Assessment & Plan:   OSA (obstructive sleep apnea) Excellent control on nocturnal CPAP  Plan  Patient Instructions  Begin Spiriva 2 puffs. Daily.  Albuterol inhaler As needed    Continue on CPAP at bedtime Work on healthy weight Do not drive aof sleepy Work on not smoking .    Continue with LDCT screening as planned.   Flu shot this fall  2nd Covid booster.   Refer to Cardiology   Follow up in 4-6 months with Dr. Elsworth Soho  or Luccia Reinheimer NP        Tobacco use disorder Smoking cessation discussed in detail  Emphysema lung ([place]) Mild emphysema on CT chest.  No significant restriction or obstruction on PFTs.  Patient does have increased symptom burden we will give a trial of Spiriva to see if this helps. She also has shortness of breath out of proportion to her lung function.  She is encouraged on smoking cessation.  Continue with low-dose CT screening program  Plan  Patient Instructions  Begin Spiriva 2 puffs. Daily.  Albuterol inhaler As needed    Continue on CPAP at bedtime Work on healthy weight Do not drive aof sleepy Work on not smoking .    Continue with LDCT screening as planned.   Flu shot this fall  2nd Covid booster.   Refer to  Cardiology   Follow up in 4-6 months with Dr. Elsworth Soho  or Astoria Condon NP        Dyspnea Dyspnea suspect is multifactorial.  She does have some mild emphysema.  Pulmonary function testing is normal.  May be related to some physical deconditioning along with her morbid obesity. She does have a strong family history of coronary artery disease.  She also has risk factors including hypertension and smoking.  Will refer to cardiology for evaluation of shortness of breath.     Rexene Edison, NP 02/08/2021

## 2021-02-08 NOTE — Progress Notes (Signed)
Patient seen in the office today and instructed on use of Spriva.  Patient expressed understanding and demonstrated technique.

## 2021-02-08 NOTE — Assessment & Plan Note (Addendum)
Mild emphysema on CT chest.  No significant restriction or obstruction on PFTs.  Patient does have increased symptom burden we will give a trial of Spiriva to see if this helps. She also has shortness of breath out of proportion to her lung function.  She is encouraged on smoking cessation.  Continue with low-dose CT screening program  Plan  Patient Instructions  Begin Spiriva 2 puffs. Daily.  Albuterol inhaler As needed    Continue on CPAP at bedtime Work on healthy weight Do not drive aof sleepy Work on not smoking .    Continue with LDCT screening as planned.   Flu shot this fall  2nd Covid booster.   Refer to Cardiology   Follow up in 4-6 months with Dr. Elsworth Soho  or Royal Piedra NP

## 2021-02-08 NOTE — Assessment & Plan Note (Signed)
Smoking cessation discussed in detail 

## 2021-02-08 NOTE — Assessment & Plan Note (Signed)
Dyspnea suspect is multifactorial.  She does have some mild emphysema.  Pulmonary function testing is normal.  May be related to some physical deconditioning along with her morbid obesity. She does have a strong family history of coronary artery disease.  She also has risk factors including hypertension and smoking.  Will refer to cardiology for evaluation of shortness of breath.

## 2021-04-02 ENCOUNTER — Ambulatory Visit: Payer: 59 | Admitting: Internal Medicine

## 2021-04-21 NOTE — Progress Notes (Signed)
Chief Complaint  Patient presents with   New Patient (Initial Visit)    Dyspnea   History of Present Illness: 55 yo female with history of mild COPD, GERD, HTN, hyperlipidemia, borderline DM, tobacco abuse, hypothyroidism and sleep apnea who is here today as a new consult for the evaluation of chest pain and dyspnea. She is a current smoker. She is followed in the pulmonary clinic for sleep apnea. She tells me today that she has been having central chest pains that come and go. The pains are central and occur mostly at rest. No exertional chest pain. She has mild dyspnea with exertion. She continues to smoke 1 ppd. She has stress at her home with her husband who is also my patient and now has Parkinson's disease.   Primary Care Physician: Marda Stalker, PA-C  Past Medical History:  Diagnosis Date   Chronic back pain    COPD (chronic obstructive pulmonary disease) ([place])    GERD (gastroesophageal reflux disease)    Hyperlipidemia    Hypertension    Hypothyroidism    Pre-diabetes    Sleep apnea     Past Surgical History:  Procedure Laterality Date   CARPAL TUNNEL RELEASE Left    COLONOSCOPY WITH PROPOFOL N/A 11/06/2016   Procedure: COLONOSCOPY WITH PROPOFOL;  Surgeon: Juanita Craver, MD;  Location: [facility];  Service: Endoscopy;  Laterality: N/A;   WISDOM TOOTH EXTRACTION      Current Outpatient Medications  Medication Sig Dispense Refill   lansoprazole (PREVACID) 15 MG capsule Take 15 mg by mouth daily.      levothyroxine (SYNTHROID) 125 MCG tablet Take 125 mcg by mouth daily before breakfast. Takes name brand only (Synthroid)     losartan-hydrochlorothiazide (HYZAAR) 50-12.5 MG tablet Take 1 tablet by mouth daily.      metoprolol tartrate (LOPRESSOR) 100 MG tablet Take 1 tablet (100 mg total) by mouth once for 1 dose. Take 90-120 minutes prior to scan. 1 tablet 0   nystatin ointment (MYCOSTATIN) Apply 1 application topically daily.     Tiotropium Bromide Monohydrate  (SPIRIVA RESPIMAT) 2.5 MCG/ACT AERS Inhale 2 puffs into the lungs daily. 1 g 5   triamcinolone ointment (KENALOG) 0.1 % Apply 1 application topically daily.     valACYclovir (VALTREX) 1000 MG tablet Take 1,000 mg by mouth daily as needed (herpes).     VENTOLIN HFA 108 (90 Base) MCG/ACT inhaler INHALE 2 PUFF(S) EVERY 4 HOURS BY INHALATION ROUTE AS NEEDED FOR 30 DAYS.     No current facility-administered medications for this visit.    Allergies  Allergen Reactions   Penicillins Anaphylaxis    Has patient had a PCN reaction causing immediate rash, facial/tongue/throat swelling, SOB or lightheadedness with hypotension: Yes Has patient had a PCN reaction causing severe rash involving mucus membranes or skin necrosis: Unknown Has patient had a PCN reaction that required hospitalization: no Has patient had a PCN reaction occurring within the last 10 years: no If all of the above answers are "NO", then may proceed with Cephalosporin use.    Bee Venom Swelling   Hydrocodone-Acetaminophen     Other reaction(s): Dizziness, Vomiting   Other Swelling and Other (See Comments)    bandaids- causes pt to turn red    Prozac [Fluoxetine] Hives   Tetracyclines & Related Other (See Comments)    Severe abdominal pain   Calamine Rash    Social History   Socioeconomic History   Marital status: Married    Spouse name: Not on file  Number of children: Not on file   Years of education: Not on file   Highest education level: Not on file  Occupational History   Not on file  Tobacco Use   Smoking status: Every Day    Packs/day: 1.00    Years: 34.00    Pack years: 34.00    Types: Cigarettes   Smokeless tobacco: Never  Vaping Use   Vaping Use: Never used  Substance and Sexual Activity   Alcohol use: Yes    Alcohol/week: 0.0 standard drinks    Comment: occasional   Drug use: No   Sexual activity: Not on file  Other Topics Concern   Not on file  Social History Narrative   Not on file    Social Determinants of Health   Financial Resource Strain: Not on file  Food Insecurity: Not on file  Transportation Needs: Not on file  Physical Activity: Not on file  Stress: Not on file  Social Connections: Not on file  Intimate Partner Violence: Not on file    Family History  Problem Relation Age of Onset   Hypertension Mother    Valvular heart disease Mother    Stroke Father        Cause of death   Coronary artery disease Brother    Hypertension Maternal Grandmother    Cancer Maternal Grandfather        Prostate   Breast cancer Paternal Grandmother    Clotting disorder Paternal Grandmother        Cause of death   Heart attack Paternal Grandfather     Review of Systems:  As stated in the HPI and otherwise negative.   BP 112/62   Pulse 78   Ht 5\' 2"  (1.575 m)   Wt 241 lb 12.8 oz (109.7 kg)   SpO2 96%   BMI 44.23 kg/m   Physical Examination: General: Well developed, well nourished, NAD  HEENT: OP clear, mucus membranes moist  SKIN: warm, dry. No rashes. Neuro: No focal deficits  Musculoskeletal: Muscle strength 5/5 all ext  Psychiatric: Mood and affect normal  Neck: No JVD, no carotid bruits, no thyromegaly, no lymphadenopathy.  Lungs:Clear bilaterally, no wheezes, rhonci, crackles Cardiovascular: Regular rate and rhythm. No murmurs, gallops or rubs. Abdomen:Soft. Bowel sounds present. Non-tender.  Extremities: No lower extremity edema. Pulses are 2 + in the bilateral DP/PT.  EKG:  EKG is ordered today. The ekg ordered today demonstrates Sinus, PVC  Recent Labs: No results found for requested labs within last 8760 hours.   Lipid Panel No results found for: CHOL, TRIG, HDL, CHOLHDL, VLDL, LDLCALC, LDLDIRECT   Wt Readings from Last 3 Encounters:  04/22/21 241 lb 12.8 oz (109.7 kg)  02/08/21 248 lb (112.5 kg)  01/03/21 251 lb 9.6 oz (114.1 kg)    Assessment and Plan:   1. Chest pain/Dyspnea: Risk factors for CAD include HTN, hyperlipidemia,  borderline DM, obesity and tobacco abuse. She has daily chest pains. Will arrange a gated cardiac CTA to exclude CAD. Echo to assess LVEF and rule out structural heart disease. BMET today  Current medicines are reviewed at length with the patient today.  The patient does not have concerns regarding medicines.  The following changes have been made:  no change  Labs/ tests ordered today include:   Orders Placed This Encounter  Procedures   CT CORONARY MORPH W/CTA COR W/SCORE W/CA W/CM &/OR WO/CM   Basic metabolic panel   EKG 02-HENI   ECHOCARDIOGRAM COMPLETE  Disposition:   F/U with me after her tests as needed.    Signed, Lauree Chandler, MD 04/22/2021 9:20 AM    [facility] [address], [place], [place]  [address] Phone: [phone]; Fax: [phone]

## 2021-04-22 ENCOUNTER — Encounter: Payer: Self-pay | Admitting: Cardiovascular Disease

## 2021-04-22 ENCOUNTER — Ambulatory Visit (INDEPENDENT_AMBULATORY_CARE_PROVIDER_SITE_OTHER): Payer: 59 | Admitting: Cardiovascular Disease

## 2021-04-22 ENCOUNTER — Other Ambulatory Visit: Payer: Self-pay

## 2021-04-22 VITALS — BP 112/62 | HR 78 | Ht 62.0 in | Wt 241.8 lb

## 2021-04-22 DIAGNOSIS — R072 Precordial pain: Secondary | ICD-10-CM | POA: Diagnosis not present

## 2021-04-22 DIAGNOSIS — R079 Chest pain, unspecified: Secondary | ICD-10-CM | POA: Diagnosis not present

## 2021-04-22 DIAGNOSIS — R0602 Shortness of breath: Secondary | ICD-10-CM | POA: Diagnosis not present

## 2021-04-22 LAB — BASIC METABOLIC PANEL
BUN/Creatinine Ratio: 11 (ref 9–23)
BUN: 9 mg/dL (ref 6–24)
CO2: 24 mmol/L (ref 20–29)
Calcium: 10.3 mg/dL — ABNORMAL HIGH (ref 8.7–10.2)
Chloride: 101 mmol/L (ref 96–106)
Creatinine, Ser: 0.8 mg/dL (ref 0.57–1.00)
Glucose: 94 mg/dL (ref 70–99)
Potassium: 4.2 mmol/L (ref 3.5–5.2)
Sodium: 138 mmol/L (ref 134–144)
eGFR: 87 mL/min/{1.73_m2} (ref >59)

## 2021-04-22 MED ORDER — METOPROLOL TARTRATE 100 MG PO TABS: 100.0000 mg | ORAL_TABLET | Freq: Once | ORAL | 0 refills | Status: DC

## 2021-04-22 NOTE — Patient Instructions (Addendum)
Medication Instructions:  No changes *If you need a refill on your cardiac medications before your next appointment, please call your pharmacy*   Lab Work: Today: BMET  If you have labs (blood work) drawn today and your tests are completely normal, you will receive your results only by: [place] (if you have MyChart) OR A paper copy in the mail If you have any lab test that is abnormal or we need to change your treatment, we will call you to review the results.   Testing/Procedures: Your physician has requested that you have an echocardiogram. Echocardiography is a painless test that uses sound waves to create images of your heart. It provides your doctor with information about the size and shape of your heart and how well your heart's chambers and valves are working. This procedure takes approximately one hour. There are no restrictions for this procedure.  Cardiac CTA - see instructions below.     Follow-Up: As needed   Other Instructions   Your cardiac CT will be scheduled at one of the below locations:   [facility] [address] [place],  [address] [phone]  Please arrive at the [facility] main entrance (entrance A) of [facility] 30 minutes prior to test start time. You can use the FREE valet parking offered at the main entrance (encouraged to control the heart rate for the test) Proceed to the [facility] Radiology Department (first floor) to check-in and test prep.   Please follow these instructions carefully (unless otherwise directed):   On the Night Before the Test: Be sure to Drink plenty of water. Do not consume any caffeinated/decaffeinated beverages or chocolate 12 hours prior to your test. Do not take any antihistamines 12 hours prior to your test.  On the Day of the Test: Drink plenty of water until 1 hour prior to the test. Do not eat any food 4 hours prior to the test. You may take your regular medications  prior to the test.  Take metoprolol (Lopressor) two hours prior to test. HOLD losartan/Hydrochlorothiazide morning of the test. FEMALES- please wear underwire-free bra if available, avoid dresses & tight clothing      After the Test: Drink plenty of water. After receiving IV contrast, you may experience a mild flushed feeling. This is normal. On occasion, you may experience a mild rash up to 24 hours after the test. This is not dangerous. If this occurs, you can take Benadryl 25 mg and increase your fluid intake. If you experience trouble breathing, this can be serious. If it is severe call 911 IMMEDIATELY. If it is mild, please call our office. If you take any of these medications: Glipizide/Metformin, Avandament, Glucavance, please do not take 48 hours after completing test unless otherwise instructed.  Please allow 2-4 weeks for scheduling of routine cardiac CTs. Some insurance companies require a pre-authorization which may delay scheduling of this test.   For non-scheduling related questions, please contact the cardiac imaging nurse navigator should you have any questions/concerns: Marchia Bond, Cardiac Imaging Nurse Navigator Gordy Clement, Cardiac Imaging Nurse Navigator [facility] Direct Office Dial: [phone]   For scheduling needs, including cancellations and rescheduling, please call [place], [phone].

## 2021-05-02 ENCOUNTER — Ambulatory Visit (HOSPITAL_COMMUNITY): Payer: 59

## 2021-05-03 ENCOUNTER — Telehealth (HOSPITAL_COMMUNITY): Payer: Self-pay | Admitting: Emergency Medicine

## 2021-05-03 NOTE — Telephone Encounter (Signed)
Reaching out to patient to offer assistance regarding upcoming cardiac imaging study; pt verbalizes understanding of appt date/time, parking situation and where to check in, pre-test NPO status and medications ordered, and verified current allergies; name and call back number provided for further questions should they arise Marchia Bond RN Navigator Cardiac Imaging [facility] [phone] office [phone] cell  Arrival time 9:00  100mg  metoprolol  Hold losartan - HCTZ Denies iv issues

## 2021-05-06 ENCOUNTER — Ambulatory Visit (HOSPITAL_COMMUNITY)
Admission: RE | Admit: 2021-05-06 | Discharge: 2021-05-06 | Disposition: A | Discharge status: Home / Self Care | Payer: 59 | Source: Ambulatory Visit | Attending: Cardiovascular Disease | Admitting: Cardiovascular Disease

## 2021-05-06 ENCOUNTER — Other Ambulatory Visit: Payer: Self-pay

## 2021-05-06 ENCOUNTER — Ambulatory Visit (HOSPITAL_BASED_OUTPATIENT_CLINIC_OR_DEPARTMENT_OTHER): Payer: 59

## 2021-05-06 DIAGNOSIS — R072 Precordial pain: Secondary | ICD-10-CM | POA: Diagnosis present

## 2021-05-06 DIAGNOSIS — R0602 Shortness of breath: Secondary | ICD-10-CM | POA: Diagnosis present

## 2021-05-06 DIAGNOSIS — R079 Chest pain, unspecified: Secondary | ICD-10-CM | POA: Diagnosis present

## 2021-05-06 LAB — ECHOCARDIOGRAM COMPLETE
Area-P 1/2: 2.97 cm2
S' Lateral: 2.6 cm

## 2021-05-06 MED ORDER — NITROGLYCERIN 0.4 MG SL SUBL
SUBLINGUAL_TABLET | SUBLINGUAL | Status: AC
  Filled 2021-05-06: qty 2

## 2021-05-06 MED ORDER — IOHEXOL 350 MG/ML SOLN
100.0000 mL | Freq: Once | INTRAVENOUS | Status: AC | PRN
  Administered 2021-05-06: 100 mL via INTRAVENOUS

## 2021-05-06 MED ORDER — NITROGLYCERIN 0.4 MG SL SUBL
0.8000 mg | SUBLINGUAL_TABLET | Freq: Once | SUBLINGUAL | Status: AC
  Administered 2021-05-06: 0.8 mg via SUBLINGUAL

## 2021-05-06 NOTE — Progress Notes (Signed)
Patient alert and oriented at discharge.  Patient states she feels fine and that her BP runs low.  Patient is ambulatory to the elevator from CT with this RN with stable and steady gait and no complaints of dizziness.  Patient very talkative about last trip to [place] with this Therapist, sports.  Patient given a Coca [employer] then walked to the elevator.  Patient states "I feel fine".  Patient instructed to drink lots of fluid and eat.

## 2021-06-20 ENCOUNTER — Ambulatory Visit: Payer: 59 | Admitting: Adult Health

## 2021-06-21 ENCOUNTER — Other Ambulatory Visit: Payer: Self-pay

## 2021-06-21 ENCOUNTER — Ambulatory Visit (INDEPENDENT_AMBULATORY_CARE_PROVIDER_SITE_OTHER)
Admission: RE | Admit: 2021-06-21 | Discharge: 2021-06-21 | Disposition: A | Discharge status: Home / Self Care | Payer: 59 | Source: Ambulatory Visit | Attending: Acute Care | Admitting: Acute Care

## 2021-06-21 DIAGNOSIS — F17210 Nicotine dependence, cigarettes, uncomplicated: Secondary | ICD-10-CM

## 2021-06-25 ENCOUNTER — Encounter: Payer: Self-pay | Admitting: Adult Health

## 2021-06-25 ENCOUNTER — Other Ambulatory Visit: Payer: Self-pay | Admitting: Acute Care

## 2021-06-25 ENCOUNTER — Ambulatory Visit (INDEPENDENT_AMBULATORY_CARE_PROVIDER_SITE_OTHER): Payer: 59 | Admitting: Adult Health

## 2021-06-25 ENCOUNTER — Other Ambulatory Visit: Payer: Self-pay

## 2021-06-25 DIAGNOSIS — F17210 Nicotine dependence, cigarettes, uncomplicated: Secondary | ICD-10-CM | POA: Diagnosis not present

## 2021-06-25 DIAGNOSIS — G4733 Obstructive sleep apnea (adult) (pediatric): Secondary | ICD-10-CM | POA: Diagnosis not present

## 2021-06-25 DIAGNOSIS — J439 Emphysema, unspecified: Secondary | ICD-10-CM

## 2021-06-25 DIAGNOSIS — F17200 Nicotine dependence, unspecified, uncomplicated: Secondary | ICD-10-CM

## 2021-06-25 DIAGNOSIS — Z87891 Personal history of nicotine dependence: Secondary | ICD-10-CM

## 2021-06-25 MED ORDER — VARENICLINE TARTRATE 1 MG PO TABS: 1.0000 mg | ORAL_TABLET | Freq: Two times a day (BID) | ORAL | 2 refills | Status: DC

## 2021-06-25 MED ORDER — VARENICLINE TARTRATE 0.5 MG X 11 & 1 MG X 42 PO TBPK: ORAL_TABLET | ORAL | 0 refills | Status: DC

## 2021-06-25 NOTE — Assessment & Plan Note (Signed)
Excellent control on CPAP continue on current settings  Plan  Patient Instructions  Spiriva 2 puffs. Daily.  Albuterol inhaler As needed    Continue on CPAP at bedtime Work on healthy weight Do not drive aof sleepy  Work on not smoking .    Continue with LDCT screening planned for January 2024 .  Begin Chantix 07/17/21 .  Set smoking quit date.   Follow up in 3 months with Dr. Elsworth Soho  or Eliud Polo NP

## 2021-06-25 NOTE — Patient Instructions (Addendum)
Spiriva 2 puffs. Daily.  Albuterol inhaler As needed    Continue on CPAP at bedtime Work on healthy weight Do not drive aof sleepy  Work on not smoking .    Continue with LDCT screening planned for January 2024 .  Begin Chantix 07/17/21 .  Set smoking quit date.   Follow up in 3 months with Dr. Elsworth Soho  or Khaila Velarde NP

## 2021-06-25 NOTE — Assessment & Plan Note (Signed)
Emphysema.  PFTs 2022 showed normal lung function. Patient had improvement on Spiriva.  Continue on current regimen  Plan  Patient Instructions  Spiriva 2 puffs. Daily.  Albuterol inhaler As needed    Continue on CPAP at bedtime Work on healthy weight Do not drive aof sleepy  Work on not smoking .    Continue with LDCT screening planned for January 2024 .  Begin Chantix 07/17/21 .  Set smoking quit date.   Follow up in 3 months with Dr. Elsworth Soho  or Jenika Chiem NP

## 2021-06-25 NOTE — Progress Notes (Signed)
@Patient  ID: Kristin Bender, female    DOB: May 06, 1966, 56 y.o.   MRN: [id number]  Chief Complaint  Patient presents with   Follow-up    Referring provider: Lenetta Quaker  HPI: 56 yo female active smoker followed for OSA and Emphysema   TEST/EVENTS :  Spirometry 06/2017 showed ratio of 85, FEV1 of 81% and FVC of 75% suggesting mild restriction  01/2021 pulmonary function testing that was completed today that shows normal lung function with an FEV1 at 88%, ratio 76, FVC 91%, no significant bronchodilator response.  DLCO was 83%.   Severe OSA 34/hr sleep study HST 06/2015 CPAP titration optimal control on 13cm    06/25/2021 Follow up: Emphysema  and OSA  Patient returns for a 33-month follow-up.  Patient has underlying emphysema.  PFTs last visit showed normal lung function and diffusing capacity.  Last visit patient was having some ongoing shortness of breath.  She was given Spiriva trial.  Since last visit patient is feeling better . Feels the Spiriva really helped with decreased dyspnea.  Participates in the LDCT screening program.  Last CT done June 21, 2021 showed multiple small pulmonary nodules stable, no suspicious appearing nodules.  Mild emphysema. Last visit she was also having some intermittent chest tightness with activities.  She has a family history of coronary disease.  She was referred to cardiology.  2D echo on May 06, 2021 showed preserved EF, mild aortic valve sclerosis, normal pulmonary artery systolic pressure.  Coronary CT showed minimal coronary artery disease.  Patient does smoke.  Smoking cessation was discussed in detail today. We discussed Chantix. No hx of depression or suicide. Has tried otc nicotine and gums with no benefit.  Patient education on Chantix .   Patient has OSA on CPAP . Wears every night for around 6 hrs . Never misses a night. Feels rested with perceived benefit on CPAP . CPAP download shows 100% compliance with avg daily  usage at 6.5hr . On CPAP auto 8-13cmH2O. AHI 0.2/hr . Avg pressure 11.8cmH2O.    Allergies  Allergen Reactions   Penicillins Anaphylaxis    Has patient had a PCN reaction causing immediate rash, facial/tongue/throat swelling, SOB or lightheadedness with hypotension: Yes Has patient had a PCN reaction causing severe rash involving mucus membranes or skin necrosis: Unknown Has patient had a PCN reaction that required hospitalization: no Has patient had a PCN reaction occurring within the last 10 years: no If all of the above answers are "NO", then may proceed with Cephalosporin use.    Bee Venom Swelling   Hydrocodone-Acetaminophen     Other reaction(s): Dizziness, Vomiting   Other Swelling and Other (See Comments)    bandaids- causes pt to turn red    Prozac [Fluoxetine] Hives   Tetracyclines & Related Other (See Comments)    Severe abdominal pain   Calamine Rash    Immunization History  Administered Date(s) Administered   PFIZER(Purple Top)SARS-COV-2 Vaccination 08/27/2019, 09/19/2019, 07/13/2020    Past Medical History:  Diagnosis Date   Chronic back pain    COPD (chronic obstructive pulmonary disease) (HCC)    GERD (gastroesophageal reflux disease)    Hyperlipidemia    Hypertension    Hypothyroidism    Pre-diabetes    Sleep apnea     Tobacco History: Social History   Tobacco Use  Smoking Status Every Day   Packs/day: 1.00   Years: 34.00   Pack years: 34.00   Types: Cigarettes  Smokeless Tobacco Never  Tobacco  Comments   Smoking a pack/day, wants to quit.  06/25/2021 hfb   Ready to quit: Yes Counseling given: Not Answered Tobacco comments: Smoking a pack/day, wants to quit.  06/25/2021 hfb   Outpatient Medications Prior to Visit  Medication Sig Dispense Refill   lansoprazole (PREVACID) 15 MG capsule Take 15 mg by mouth daily.      levothyroxine (SYNTHROID) 125 MCG tablet Take 125 mcg by mouth daily before breakfast. Takes name brand only (Synthroid)      losartan-hydrochlorothiazide (HYZAAR) 50-12.5 MG tablet Take 1 tablet by mouth daily.      nystatin ointment (MYCOSTATIN) Apply 1 application topically daily.     Tiotropium Bromide Monohydrate (SPIRIVA RESPIMAT) 2.5 MCG/ACT AERS Inhale 2 puffs into the lungs daily. 1 g 5   triamcinolone ointment (KENALOG) 0.1 % Apply 1 application topically daily.     valACYclovir (VALTREX) 1000 MG tablet Take 1,000 mg by mouth daily as needed (herpes).     VENTOLIN HFA 108 (90 Base) MCG/ACT inhaler INHALE 2 PUFF(S) EVERY 4 HOURS BY INHALATION ROUTE AS NEEDED FOR 30 DAYS.     metoprolol tartrate (LOPRESSOR) 100 MG tablet Take 1 tablet (100 mg total) by mouth once for 1 dose. Take 90-120 minutes prior to scan. 1 tablet 0   No facility-administered medications prior to visit.     Review of Systems:   Constitutional:   No  weight loss, night sweats,  Fevers, chills, fatigue, or  lassitude.  HEENT:   No headaches,  Difficulty swallowing,  Tooth/dental problems, or  Sore throat,                No sneezing, itching, ear ache, nasal congestion, post nasal drip,   CV:  No chest pain,  Orthopnea, PND, swelling in lower extremities, anasarca, dizziness, palpitations, syncope.   GI  No heartburn, indigestion, abdominal pain, nausea, vomiting, diarrhea, change in bowel habits, loss of appetite, bloody stools.   Resp:   No chest wall deformity  Skin: no rash or lesions.  GU: no dysuria, change in color of urine, no urgency or frequency.  No flank pain, no hematuria   MS:  No joint pain or swelling.  No decreased range of motion.  No back pain.    Physical Exam  BP 110/60 (BP Location: Left Arm, Patient Position: Sitting, Cuff Size: Large)    Pulse (!) 109    Temp 98.2 F (36.8 C) (Oral)    Ht 5' 2.5" (1.588 m)    Wt 243 lb 3.2 oz (110.3 kg)    SpO2 96%    BMI 43.77 kg/m   GEN: A/Ox3; pleasant , NAD, well nourished    HEENT:  [place]/AT,  NOSE-clear, THROAT-clear, no lesions, no postnasal drip or exudate  noted.   NECK:  Supple w/ fair ROM; no JVD; normal carotid impulses w/o bruits; no thyromegaly or nodules palpated; no lymphadenopathy.    RESP  Clear  P & A; w/o, wheezes/ rales/ or rhonchi. no accessory muscle use, no dullness to percussion  CARD:  RRR, no m/r/g, no peripheral edema, pulses intact, no cyanosis or clubbing.  GI:   Soft & nt; nml bowel sounds; no organomegaly or masses detected.   Musco: Warm bil, no deformities or joint swelling noted.   Neuro: alert, no focal deficits noted.    Skin: Warm, no lesions or rashes    Lab Results:  CBC     BNP No results found for: BNP  ProBNP No results found for: PROBNP  Imaging: CT CHEST LCS NODULE F/U W/O CONTRAST  Result Date: 06/24/2021 CLINICAL DATA:  56 year old female current smoker with 40 pack-year history of smoking. Lung cancer screening examination. EXAM: CT CHEST WITHOUT CONTRAST FOR LUNG CANCER SCREENING NODULE FOLLOW-UP TECHNIQUE: Multidetector CT imaging of the chest was performed following the standard protocol without IV contrast. COMPARISON:  Low-dose lung cancer screening chest CT 12/03/2020. FINDINGS: Cardiovascular: Heart size is normal. There is no significant pericardial fluid, thickening or pericardial calcification. There is aortic atherosclerosis, as well as atherosclerosis of the great vessels of the mediastinum and the coronary arteries, including calcified atherosclerotic plaque in the left main, left anterior descending, left circumflex and right coronary arteries. Mediastinum/Nodes: No pathologically enlarged mediastinal or hilar lymph nodes. Please note that accurate exclusion of hilar adenopathy is limited on noncontrast CT scans. Esophagus is unremarkable in appearance. No axillary lymphadenopathy. Lungs/Pleura: Multiple small pulmonary nodules are again noted throughout the lungs bilaterally, largest of which is in the posterior aspect of the right upper lobe near the apex (axial image 226 of series  3), with a volume derived mean diameter of 5.9 mm. No other larger more suspicious appearing pulmonary nodules or masses are noted. No acute consolidative airspace disease. No pleural effusions. Mild diffuse bronchial wall thickening with mild centrilobular and paraseptal emphysema. Upper Abdomen: Aortic atherosclerosis. Musculoskeletal: There are no aggressive appearing lytic or blastic lesions noted in the visualized portions of the skeleton. IMPRESSION: 1. Lung-RADS 2S, benign appearance or behavior. Continue annual screening with low-dose chest CT without contrast in 12 months. 2. The "S" modifier above refers to potentially clinically significant non lung cancer related findings. Specifically, there is aortic atherosclerosis, in addition to left main and three-vessel coronary artery disease. Please note that although the presence of coronary artery calcium documents the presence of coronary artery disease, the severity of this disease and any potential stenosis cannot be assessed on this non-gated CT examination. Assessment for potential risk factor modification, dietary therapy or pharmacologic therapy may be warranted, if clinically indicated. 3. Mild diffuse bronchial wall thickening with mild centrilobular and paraseptal emphysema; imaging findings suggestive of underlying COPD. Aortic Atherosclerosis (ICD10-I70.0) and Emphysema (ICD10-J43.9). Electronically Signed   By: Vinnie Langton M.D.   On: 06/24/2021 09:37     PFT Results Latest Ref Rng & Units 02/08/2021  FVC-Pre L 2.70  FVC-Predicted Pre % 84  FVC-Post L 2.91  FVC-Predicted Post % 91  Pre FEV1/FVC % % 78  Post FEV1/FCV % % 76  FEV1-Pre L 2.10  FEV1-Predicted Pre % 84  FEV1-Post L 2.22  DLCO uncorrected ml/min/mmHg 16.08  DLCO UNC% % 83  DLCO corrected ml/min/mmHg 16.08  DLCO COR %Predicted % 83  DLVA Predicted % 81  TLC L 5.14  TLC % Predicted % 108  RV % Predicted % 133    No results found for:  NITRICOXIDE      Assessment & Plan:   OSA (obstructive sleep apnea) Excellent control on CPAP continue on current settings  Plan  Patient Instructions  Spiriva 2 puffs. Daily.  Albuterol inhaler As needed    Continue on CPAP at bedtime Work on healthy weight Do not drive aof sleepy  Work on not smoking .    Continue with LDCT screening planned for January 2024 .  Begin Chantix 07/17/21 .  Set smoking quit date.   Follow up in 3 months with Dr. Elsworth Soho  or Alandria Butkiewicz NP        Tobacco use disorder Long discussion with patient  regarding smoking cessation.  We went over different options.  Patient wants to try Chantix.  Chantix prescription will be given.  Patient education was given in detail.  Patient was advised to call immediately if she develops any depression or suicidal ideations. Follow back up in 3 months.  Plan  Patient Instructions  Spiriva 2 puffs. Daily.  Albuterol inhaler As needed    Continue on CPAP at bedtime Work on healthy weight Do not drive aof sleepy  Work on not smoking .    Continue with LDCT screening planned for January 2024 .  Begin Chantix 07/17/21 .  Set smoking quit date.   Follow up in 3 months with Dr. Elsworth Soho  or Juliauna Stueve NP         Emphysema lung [facility]) Emphysema.  PFTs 2022 showed normal lung function. Patient had improvement on Spiriva.  Continue on current regimen  Plan  Patient Instructions  Spiriva 2 puffs. Daily.  Albuterol inhaler As needed    Continue on CPAP at bedtime Work on healthy weight Do not drive aof sleepy  Work on not smoking .    Continue with LDCT screening planned for January 2024 .  Begin Chantix 07/17/21 .  Set smoking quit date.   Follow up in 3 months with Dr. Elsworth Soho  or Jahayra Mazo NP          Rexene Edison, NP 06/25/2021

## 2021-06-25 NOTE — Assessment & Plan Note (Signed)
Long discussion with patient regarding smoking cessation.  We went over different options.  Patient wants to try Chantix.  Chantix prescription will be given.  Patient education was given in detail.  Patient was advised to call immediately if she develops any depression or suicidal ideations. Follow back up in 3 months.  Plan  Patient Instructions  Spiriva 2 puffs. Daily.  Albuterol inhaler As needed    Continue on CPAP at bedtime Work on healthy weight Do not drive aof sleepy  Work on not smoking .    Continue with LDCT screening planned for January 2024 .  Begin Chantix 07/17/21 .  Set smoking quit date.   Follow up in 3 months with Dr. Elsworth Soho  or Clarice Bonaventure NP

## 2021-09-11 ENCOUNTER — Other Ambulatory Visit: Payer: Self-pay | Admitting: Adult Health

## 2021-09-30 ENCOUNTER — Ambulatory Visit: Payer: 59 | Admitting: Adult Health

## 2022-01-14 ENCOUNTER — Encounter: Payer: Self-pay | Admitting: Radiology

## 2022-01-14 ENCOUNTER — Other Ambulatory Visit (HOSPITAL_COMMUNITY)
Admission: RE | Admit: 2022-01-14 | Discharge: 2022-01-14 | Disposition: A | Discharge status: Home / Self Care | Payer: 59 | Source: Ambulatory Visit | Attending: Radiology | Admitting: Radiology

## 2022-01-14 ENCOUNTER — Ambulatory Visit (INDEPENDENT_AMBULATORY_CARE_PROVIDER_SITE_OTHER): Payer: 59 | Admitting: Radiology

## 2022-01-14 VITALS — BP 118/68 | Ht 62.0 in | Wt 217.0 lb

## 2022-01-14 DIAGNOSIS — N763 Subacute and chronic vulvitis: Secondary | ICD-10-CM

## 2022-01-14 DIAGNOSIS — Z01419 Encounter for gynecological examination (general) (routine) without abnormal findings: Secondary | ICD-10-CM

## 2022-01-14 DIAGNOSIS — B009 Herpesviral infection, unspecified: Secondary | ICD-10-CM | POA: Diagnosis not present

## 2022-01-14 MED ORDER — VALACYCLOVIR HCL 1 G PO TABS: 1000.0000 mg | ORAL_TABLET | Freq: Every day | ORAL | 6 refills | Status: DC | PRN

## 2022-01-14 MED ORDER — NYSTATIN 100000 UNIT/GM EX OINT: 1.0000 | TOPICAL_OINTMENT | Freq: Two times a day (BID) | CUTANEOUS | 3 refills | Status: DC

## 2022-01-14 MED ORDER — TRIAMCINOLONE ACETONIDE 0.1 % EX OINT: 1.0000 | TOPICAL_OINTMENT | Freq: Two times a day (BID) | CUTANEOUS | 3 refills | Status: DC

## 2022-01-14 NOTE — Progress Notes (Signed)
Kristin Bender 30-May-1966 [id number]   History: Postmenopausal 56 y.o. presents for annual exam. No concerns. Needs refills on Valtrex and nystatin/triamcinolone for recurrent vulvitis. Husband was recently diagnosed with parkinsons.   Gynecologic History Postmenopausal Last Pap: 2019. Results were: normal Last mammogram: 2022. Results were: normal Last colonoscopy: 2018 HRT use: no  Obstetric History OB History  Gravida Para Term Preterm AB Living  2 0     2 0  SAB IAB Ectopic Multiple Live Births               # Outcome Date GA Lbr Len/2nd Weight Sex Delivery Anes PTL Lv  2 AB           1 AB              The following portions of the patient's history were reviewed and updated as appropriate: allergies, current medications, past family history, past medical history, past social history, past surgical history, and problem list.  Review of Systems Pertinent items noted in HPI and remainder of comprehensive ROS otherwise negative.  Past medical history, past surgical history, family history and social history were all reviewed and documented in the EPIC chart.  Exam:  Vitals:   01/14/22 0758  BP: 118/68  Weight: 217 lb (98.4 kg)  Height: '5\' 2"'$  (1.575 m)   Body mass index is 39.69 kg/m.  General appearance:  Normal Thyroid:  Symmetrical, normal in size, without palpable masses or nodularity. Respiratory  Auscultation:  Clear without wheezing or rhonchi Cardiovascular  Auscultation:  Regular rate, without rubs, murmurs or gallops  Edema/varicosities:  Not grossly evident Abdominal  Soft,nontender, without masses, guarding or rebound.  Liver/spleen:  No organomegaly noted  Hernia:  None appreciated  Skin  Inspection:  Grossly normal Breasts: Examined lying and sitting.   Right: Without masses, retractions, nipple discharge or axillary adenopathy.   Left: Without masses, retractions, nipple discharge or axillary adenopathy. Genitourinary   Inguinal/mons:   Normal without inguinal adenopathy  External genitalia:  Normal appearing vulva with no masses, tenderness, or lesions  BUS/Urethra/Skene's glands:  Normal  Vagina:  Normal appearing with normal color and discharge, no lesions. Atrophy: moderate   Cervix:  Normal appearing without discharge or lesions  Uterus:  Normal in size, shape and contour.  Midline and mobile, nontender  Adnexa/parametria:     Rt: Normal in size, without masses or tenderness.   Lt: Normal in size, without masses or tenderness.  Anus and perineum: Normal    Patient informed chaperone available to be present for breast and pelvic exam. Patient has requested no chaperone to be present. Patient has been advised what will be completed during breast and pelvic exam.   Assessment/Plan:   1. Well woman exam with routine gynecological exam - Schedule mammo - DEXA 2024 - Cytology - PAP( [place])  2. HSV infection  - valACYclovir (VALTREX) 1000 MG tablet; Take 1 tablet (1,000 mg total) by mouth daily as needed (herpes).  Dispense: 30 tablet; Refill: 6  3. Subacute vulvitis  - nystatin ointment (MYCOSTATIN); Apply 1 Application topically 2 (two) times daily.  Dispense: 30 g; Refill: 3 - triamcinolone ointment (KENALOG) 0.1 %; Apply 1 Application topically 2 (two) times daily.  Dispense: 30 g; Refill: 3     Discussed SBE, colonoscopy and DEXA screening as directed. Recommend 165mns of exercise weekly, including weight bearing exercise. Encouraged the use of seatbelts and sunscreen.  Return in 1 year for annual or sooner prn.  CSloan  Christena Sunderlin B WHNP-BC, 8:55 AM 01/14/2022

## 2022-01-16 LAB — CYTOLOGY - PAP
Comment: NEGATIVE
Diagnosis: NEGATIVE
High risk HPV: NEGATIVE

## 2022-03-18 IMAGING — CT CT CHEST LCS NODULE FOLLOW-UP W/O CM
1 series · 10 of 10 positions shown, 13 images · non-contrast
Comparison: Low-dose lung cancer screening chest CT 12/03/2020.

CLINICAL DATA: 55-year-old female current smoker with 40 pack-year
history of smoking. Lung cancer screening examination.

EXAM:
CT CHEST WITHOUT CONTRAST FOR LUNG CANCER SCREENING NODULE FOLLOW-UP
TECHNIQUE: Multidetector CT imaging of the chest was performed following the
standard protocol without IV contrast.

[ct lung segmentation data · axial · 0.77mm/px · z∈[-321,-321]mm · 10 of 291 frames shown]
[frame 1/291  mediastinal]
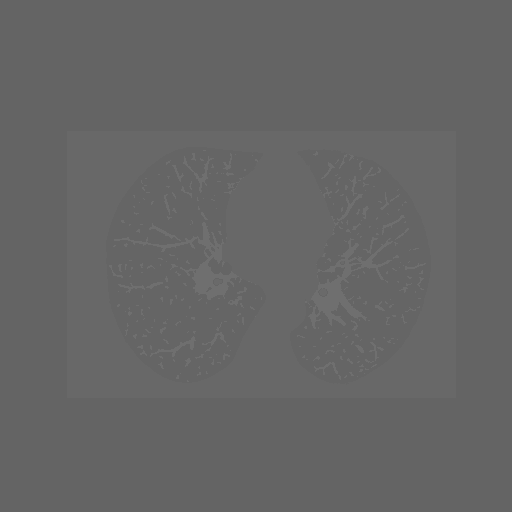
[frame 1/291  lung]
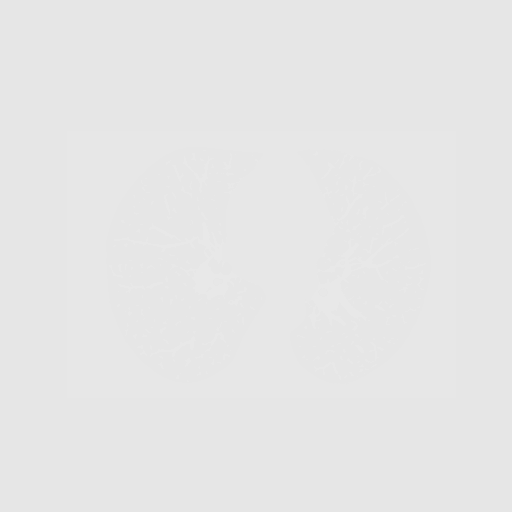
[frame 33/291  lung]
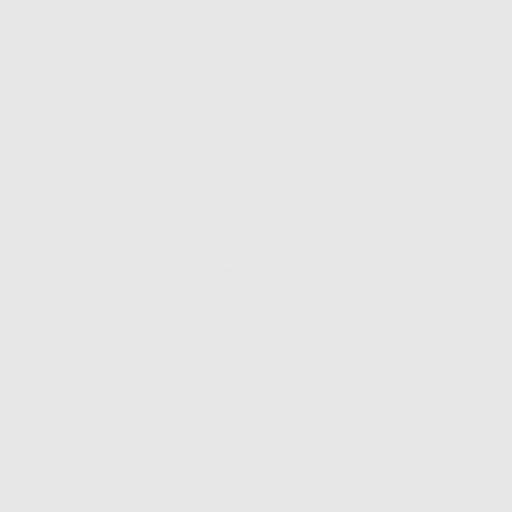
[frame 65/291  lung]
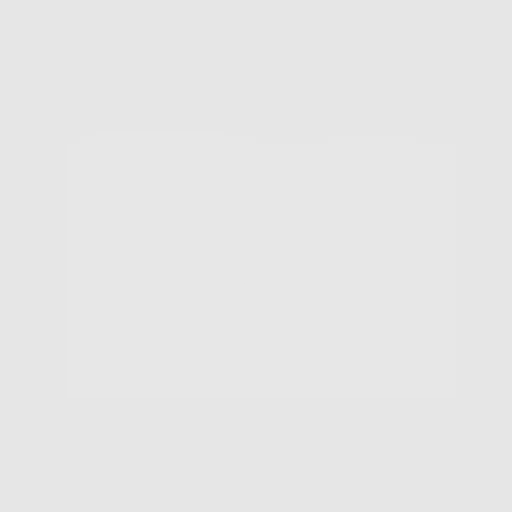
[frame 97/291  lung]
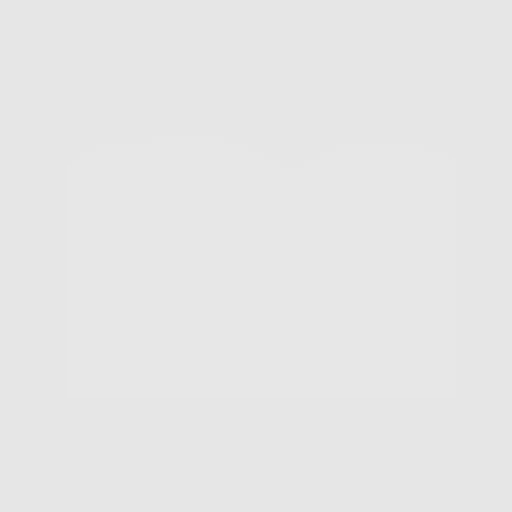
[frame 129/291  mediastinal]
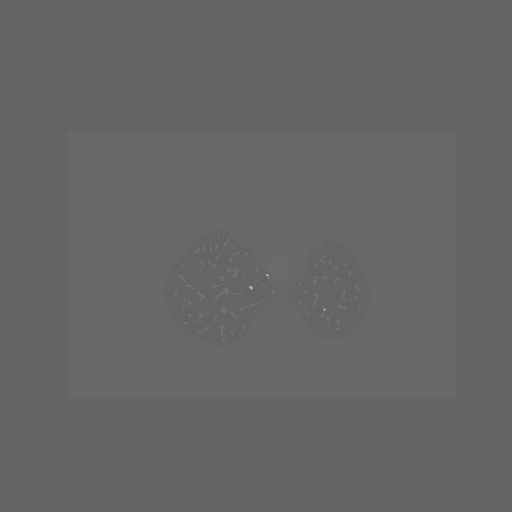
[frame 129/291  lung]
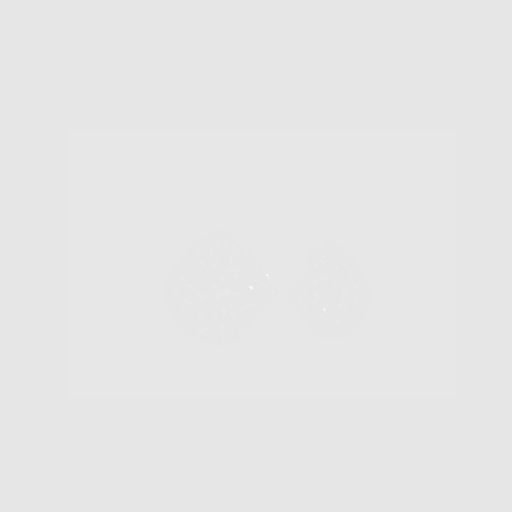
[frame 162/291  lung]
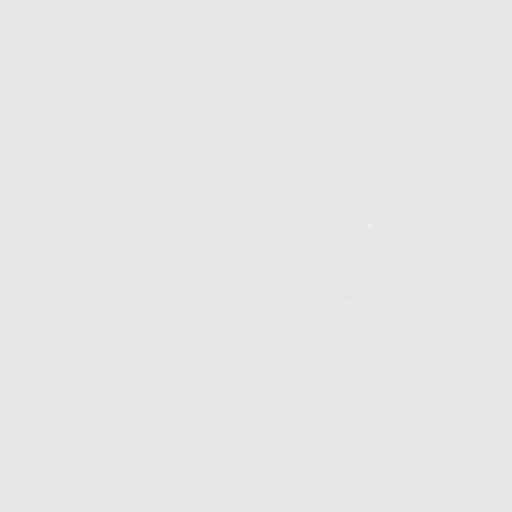
[frame 194/291  lung]
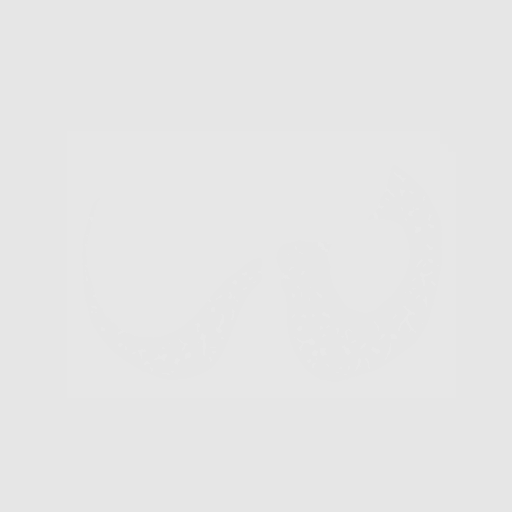
[frame 226/291  lung]
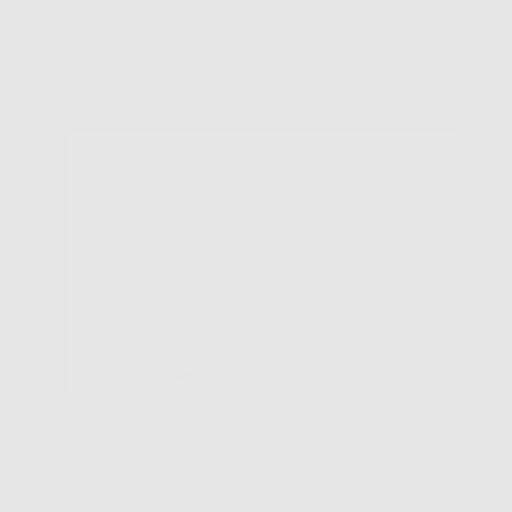
[frame 258/291  mediastinal]
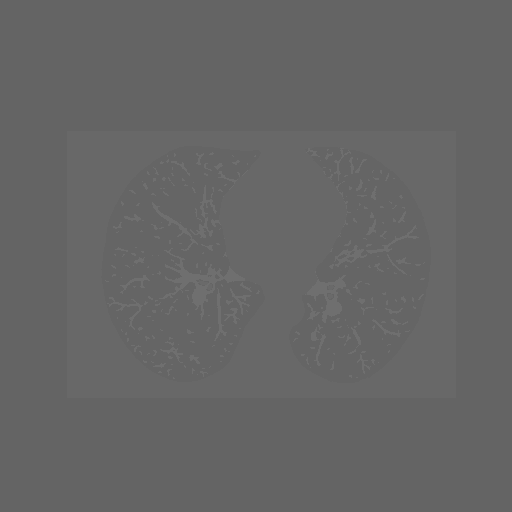
[frame 258/291  lung]
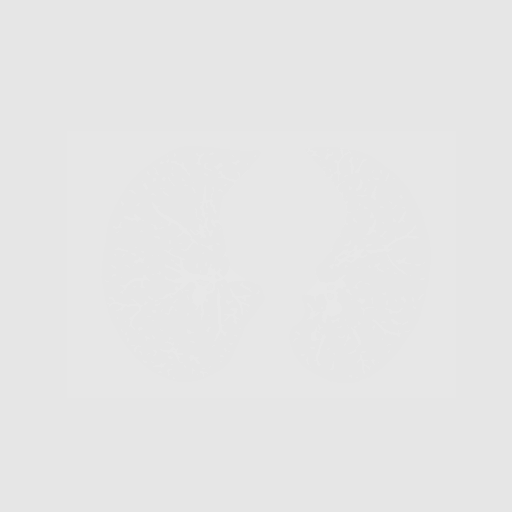
[frame 291/291  lung]
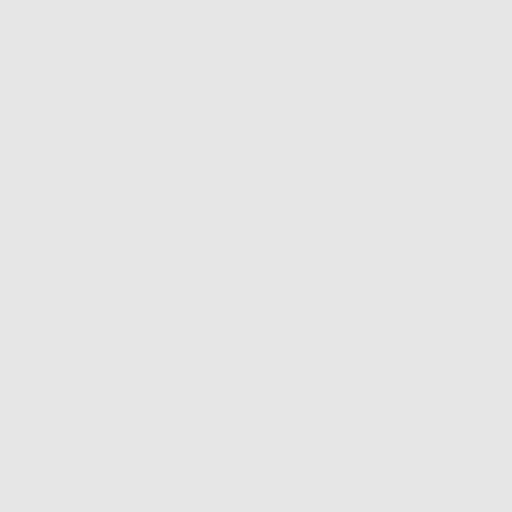

[10 of 10 positions shown; findings below may reference images not displayed]

FINDINGS: Cardiovascular: Heart size is normal. There is no significant
pericardial fluid, thickening or pericardial calcification. There is
aortic atherosclerosis, as well as atherosclerosis of the great
vessels of the mediastinum and the coronary arteries, including
calcified atherosclerotic plaque in the left main, left anterior
descending, left circumflex and right coronary arteries.

Mediastinum/Nodes: No pathologically enlarged mediastinal or hilar
lymph nodes. Please note that accurate exclusion of hilar adenopathy
is limited on noncontrast CT scans. Esophagus is unremarkable in
appearance. No axillary lymphadenopathy.

Lungs/Pleura: Multiple small pulmonary nodules are again noted
throughout the lungs bilaterally, largest of which is in the
posterior aspect of the right upper lobe near the apex (axial image
226 of series 3), with a volume derived mean diameter of 5.9 mm. No
other larger more suspicious appearing pulmonary nodules or masses
are noted. No acute consolidative airspace disease. No pleural
effusions. Mild diffuse bronchial wall thickening with mild
centrilobular and paraseptal emphysema.

Upper Abdomen: Aortic atherosclerosis.

Musculoskeletal: There are no aggressive appearing lytic or blastic
lesions noted in the visualized portions of the skeleton.
IMPRESSION: 1. Lung-RADS 2S, benign appearance or behavior. Continue annual
screening with low-dose chest CT without contrast in 12 months.
2. The "S" modifier above refers to potentially clinically
significant non lung cancer related findings. Specifically, there is
aortic atherosclerosis, in addition to left main and three-vessel
coronary artery disease. Please note that although the presence of
coronary artery calcium documents the presence of coronary artery
disease, the severity of this disease and any potential stenosis
cannot be assessed on this non-gated CT examination. Assessment for
potential risk factor modification, dietary therapy or pharmacologic
therapy may be warranted, if clinically indicated.
3. Mild diffuse bronchial wall thickening with mild centrilobular
and paraseptal emphysema; imaging findings suggestive of underlying
COPD.

Aortic Atherosclerosis ([id number]) and Emphysema ([id number].P).

## 2022-04-06 ENCOUNTER — Other Ambulatory Visit: Payer: Self-pay | Admitting: Adult Health

## 2022-06-23 ENCOUNTER — Ambulatory Visit (HOSPITAL_COMMUNITY)
Admission: RE | Admit: 2022-06-23 | Discharge: 2022-06-23 | Disposition: A | Discharge status: Home / Self Care | Payer: 59 | Source: Ambulatory Visit | Attending: Acute Care | Admitting: Acute Care

## 2022-06-23 DIAGNOSIS — I700 Atherosclerosis of aorta: Secondary | ICD-10-CM | POA: Insufficient documentation

## 2022-06-23 DIAGNOSIS — Z122 Encounter for screening for malignant neoplasm of respiratory organs: Secondary | ICD-10-CM | POA: Diagnosis not present

## 2022-06-23 DIAGNOSIS — J432 Centrilobular emphysema: Secondary | ICD-10-CM | POA: Insufficient documentation

## 2022-06-23 DIAGNOSIS — Z87891 Personal history of nicotine dependence: Secondary | ICD-10-CM

## 2022-06-23 DIAGNOSIS — I2510 Atherosclerotic heart disease of native coronary artery without angina pectoris: Secondary | ICD-10-CM | POA: Diagnosis not present

## 2022-06-23 DIAGNOSIS — F17210 Nicotine dependence, cigarettes, uncomplicated: Secondary | ICD-10-CM | POA: Insufficient documentation

## 2022-06-26 ENCOUNTER — Ambulatory Visit (INDEPENDENT_AMBULATORY_CARE_PROVIDER_SITE_OTHER): Payer: 59 | Admitting: Adult Health

## 2022-06-26 ENCOUNTER — Encounter: Payer: Self-pay | Admitting: Adult Health

## 2022-06-26 VITALS — BP 110/60 | HR 86 | Temp 98.5°F | Ht 62.5 in | Wt 243.4 lb

## 2022-06-26 DIAGNOSIS — J439 Emphysema, unspecified: Secondary | ICD-10-CM | POA: Diagnosis not present

## 2022-06-26 DIAGNOSIS — G4733 Obstructive sleep apnea (adult) (pediatric): Secondary | ICD-10-CM | POA: Diagnosis not present

## 2022-06-26 DIAGNOSIS — F17200 Nicotine dependence, unspecified, uncomplicated: Secondary | ICD-10-CM

## 2022-06-26 DIAGNOSIS — R918 Other nonspecific abnormal finding of lung field: Secondary | ICD-10-CM

## 2022-06-26 NOTE — Assessment & Plan Note (Addendum)
Smoking cessation discussed Continue with yearly CT screening through the lung cancer screening program

## 2022-06-26 NOTE — Assessment & Plan Note (Signed)
Patient has known multiple lung nodules.  Recent CT chest showed majority are stable.  Some new areas of nodular groundglass attenuation bilaterally.  Suspect is due to recent viral illness.  Will repeat CT chest in 6 months for follow-up as patient is an active smoker

## 2022-06-26 NOTE — Patient Instructions (Addendum)
Continue on Stiolto 2 puffs daily lbuterol inhaler As needed    Continue on CPAP at bedtime Work on healthy weight Do not drive aof sleepy Order for new CPAP .   Work on not smoking .    CT chest in 6 months -follow lung nodules.   Follow up in 6 months with Dr. Elsworth Soho  or Bo Teicher NP

## 2022-06-26 NOTE — Progress Notes (Signed)
$'@Patient'G$  ID: Kristin Bender, female    DOB: 1965/07/07, 57 y.o.   MRN: [id number]  Chief Complaint  Patient presents with   Follow-up    Referring provider: Lenetta Quaker  HPI: 57 year old female active smoker followed for sleep apnea and emphysema  TEST/EVENTS :  Spirometry 06/2017 showed ratio of 85, FEV1 of 81% and FVC of 75% suggesting mild restriction   01/2021 pulmonary function testing that was completed today that shows normal lung function with an FEV1 at 88%, ratio 76, FVC 91%, no significant bronchodilator response.  DLCO was 83%.   Severe OSA 34/hr sleep study HST 06/2015 CPAP titration optimal control on 13cm     06/26/2022 Follow up: OSA and Emphysema  Patient presents for 1 year follow-up.  Patient has underlying sleep apnea.  She is on CPAP at bedtime.  Patient says she wears her CPAP every single night.  Cannot sleep without it.  CPAP download shows 100% compliance.  Daily average usage at 6 hours.  Patient is on auto CPAP 8 to 13 cm H2O.  AHI is 0.2. Patient says she benefits from CPAP with decreased daytime sleepiness. Uses a full face mask.  Feels machine is not working as well, air is very cold.   Patient is active smoker.  We discussed smoking cessation.  She participates in the lung cancer screening program.  Most recent CT chest showed stable pulmonary nodules.  Mild emphysema.  New areas of nodular areas of groundglass attenuation scattered throughout the lungs bilaterally, largest in right lower lobe measuring 18.2 mm.  Patient says she did have a cold-like symptoms 1 week ago that have now resolved.  She denies any hemoptysis, chest pain, unintentional weight loss. She was changed from Spiriva to [facility] by her primary care provider couple months ago and feels that it is working well with decreased shortness of breath.        Allergies  Allergen Reactions   Penicillins Anaphylaxis    Has patient had a PCN reaction causing immediate rash,  facial/tongue/throat swelling, SOB or lightheadedness with hypotension: Yes Has patient had a PCN reaction causing severe rash involving mucus membranes or skin necrosis: Unknown Has patient had a PCN reaction that required hospitalization: no Has patient had a PCN reaction occurring within the last 10 years: no If all of the above answers are "NO", then may proceed with Cephalosporin use.    Bee Venom Swelling   Hydrocodone-Acetaminophen     Other reaction(s): Dizziness, Vomiting   Other Swelling and Other (See Comments)    bandaids- causes pt to turn red    Prozac [Fluoxetine] Hives   Tetracyclines & Related Other (See Comments)    Severe abdominal pain   Calamine Rash    Immunization History  Administered Date(s) Administered   PFIZER(Purple Top)SARS-COV-2 Vaccination 08/27/2019, 09/19/2019, 07/13/2020    Past Medical History:  Diagnosis Date   Chronic back pain    COPD (chronic obstructive pulmonary disease) (HCC)    GERD (gastroesophageal reflux disease)    Hyperlipidemia    Hypertension    Hypothyroidism    Pre-diabetes    Sleep apnea     Tobacco History: Social History   Tobacco Use  Smoking Status Every Day   Packs/day: 1.00   Years: 34.00   Total pack years: 34.00   Types: Cigarettes  Smokeless Tobacco Never  Tobacco Comments   Smoking a pack/day, wants to quit.  06/26/2022 hfb   Ready to quit: Not Answered Counseling given: Not  Answered Tobacco comments: Smoking a pack/day, wants to quit.  06/26/2022 hfb   Outpatient Medications Prior to Visit  Medication Sig Dispense Refill   celecoxib (CELEBREX) 100 MG capsule Take 100 mg by mouth 2 (two) times daily as needed.     lansoprazole (PREVACID) 15 MG capsule Take 15 mg by mouth daily.      levothyroxine (SYNTHROID) 125 MCG tablet Take 125 mcg by mouth daily before breakfast. Takes name brand only (Synthroid)     losartan-hydrochlorothiazide (HYZAAR) 50-12.5 MG tablet Take 1 tablet by mouth daily.       nystatin ointment (MYCOSTATIN) Apply 1 Application topically 2 (two) times daily. 30 g 3   rosuvastatin (CRESTOR) 5 MG tablet Does not take often     STIOLTO RESPIMAT 2.5-2.5 MCG/ACT AERS SMARTSIG:2 Puff(s) Via Inhaler Daily     triamcinolone ointment (KENALOG) 0.1 % Apply 1 Application topically 2 (two) times daily. 30 g 3   valACYclovir (VALTREX) 1000 MG tablet Take 1 tablet (1,000 mg total) by mouth daily as needed (herpes). 30 tablet 6   VENTOLIN HFA 108 (90 Base) MCG/ACT inhaler INHALE 2 PUFF(S) EVERY 4 HOURS BY INHALATION ROUTE AS NEEDED FOR 30 DAYS.     metoprolol tartrate (LOPRESSOR) 100 MG tablet Take 1 tablet (100 mg total) by mouth once for 1 dose. Take 90-120 minutes prior to scan. 1 tablet 0   SPIRIVA RESPIMAT 2.5 MCG/ACT AERS INHALE 2 PUFFS BY MOUTH INTO THE LUNGS DAILY (Patient not taking: Reported on 06/26/2022) 4 g 5   varenicline (CHANTIX CONTINUING MONTH PAK) 1 MG tablet Take 1 tablet (1 mg total) by mouth 2 (two) times daily. (Patient not taking: Reported on 01/14/2022) 60 tablet 2   varenicline (CHANTIX PAK) 0.5 MG X 11 & 1 MG X 42 tablet Take one 0.5 mg tablet by mouth once daily for 3 days, then increase to one 0.5 mg tablet twice daily for 4 days, then increase to one 1 mg tablet twice daily. (Patient not taking: Reported on 01/14/2022) 53 tablet 0   No facility-administered medications prior to visit.     Review of Systems:   Constitutional:   No  weight loss, night sweats,  Fevers, chills, fatigue, or  lassitude.  HEENT:   No headaches,  Difficulty swallowing,  Tooth/dental problems, or  Sore throat,                No sneezing, itching, ear ache, nasal congestion, post nasal drip,   CV:  No chest pain,  Orthopnea, PND, swelling in lower extremities, anasarca, dizziness, palpitations, syncope.   GI  No heartburn, indigestion, abdominal pain, nausea, vomiting, diarrhea, change in bowel habits, loss of appetite, bloody stools.   Resp: No shortness of breath with exertion  or at rest.  No excess mucus, no productive cough,  No non-productive cough,  No coughing up of blood.  No change in color of mucus.  No wheezing.  No chest wall deformity  Skin: no rash or lesions.  GU: no dysuria, change in color of urine, no urgency or frequency.  No flank pain, no hematuria   MS:  No joint pain or swelling.  No decreased range of motion.  No back pain.    Physical Exam  BP 110/60 (BP Location: Left Arm, Patient Position: Sitting, Cuff Size: Large)   Pulse 86   Temp 98.5 F (36.9 C) (Oral)   Ht 5' 2.5" (1.588 m)   Wt 243 lb 6.4 oz (110.4 kg)   SpO2  97%   BMI 43.81 kg/m   GEN: A/Ox3; pleasant , NAD, well nourished    HEENT:  [place]/AT,   NOSE-clear, THROAT-clear, no lesions, no postnasal drip or exudate noted.   NECK:  Supple w/ fair ROM; no JVD; normal carotid impulses w/o bruits; no thyromegaly or nodules palpated; no lymphadenopathy.    RESP  Clear  P & A; w/o, wheezes/ rales/ or rhonchi. no accessory muscle use, no dullness to percussion  CARD:  RRR, no m/r/g, no peripheral edema, pulses intact, no cyanosis or clubbing.  GI:   Soft & nt; nml bowel sounds; no organomegaly or masses detected.   Musco: Warm bil, no deformities or joint swelling noted.   Neuro: alert, no focal deficits noted.    Skin: Warm, no lesions or rashes    Lab Results:   BMET   BNP No results found for: "BNP"  ProBNP No results found for: "PROBNP"  Imaging: CT CHEST LUNG CA SCREEN LOW DOSE W/O CM  Result Date: 06/24/2022 CLINICAL DATA:  57 year old female current smoker with 40 pack-year history of smoking. Lung cancer screening examination. EXAM: CT CHEST WITHOUT CONTRAST LOW-DOSE FOR LUNG CANCER SCREENING TECHNIQUE: Multidetector CT imaging of the chest was performed following the standard protocol without IV contrast. RADIATION DOSE REDUCTION: This exam was performed according to the departmental dose-optimization program which includes automated exposure control,  adjustment of the mA and/or kV according to patient size and/or use of iterative reconstruction technique. COMPARISON:  Low-dose lung cancer screening chest CT 06/21/2021. FINDINGS: Cardiovascular: Heart size is normal. There is no significant pericardial fluid, thickening or pericardial calcification. There is aortic atherosclerosis, as well as atherosclerosis of the great vessels of the mediastinum and the coronary arteries, including calcified atherosclerotic plaque in the left anterior descending, left circumflex and right coronary arteries. Mediastinum/Nodes: No pathologically enlarged mediastinal or hilar lymph nodes. Please note that accurate exclusion of hilar adenopathy is limited on noncontrast CT scans. Esophagus is unremarkable in appearance. No axillary lymphadenopathy. Lungs/Pleura: Several small pulmonary nodules are generally stable compared to the prior examination, however, there also multiple new slightly nodular areas of ground-glass attenuation scattered throughout the lungs bilaterally, which are nonspecific, but likely of infectious or inflammatory etiology, with the largest of these in the right lower lobe (axial image 182) demonstrating a volume derived mean diameter of 18.2 mm. No other larger more suspicious appearing pulmonary nodules or masses are noted. No acute consolidative airspace disease. No pleural effusions. Mild diffuse bronchial wall thickening with mild centrilobular and paraseptal emphysema. Upper Abdomen: Aortic atherosclerosis. Musculoskeletal: There are no aggressive appearing lytic or blastic lesions noted in the visualized portions of the skeleton. IMPRESSION: 1. Lung-RADS 2S, benign appearance or behavior. Continue annual screening with low-dose chest CT without contrast in 12 months. 2. The "S" modifier above refers to potentially clinically significant non lung cancer related findings. Specifically, there is aortic atherosclerosis, in addition to three-vessel coronary  artery disease. Please note that although the presence of coronary artery calcium documents the presence of coronary artery disease, the severity of this disease and any potential stenosis cannot be assessed on this non-gated CT examination. Assessment for potential risk factor modification, dietary therapy or pharmacologic therapy may be warranted, if clinically indicated. 3. Mild diffuse bronchial wall thickening with mild centrilobular and paraseptal emphysema; imaging findings suggestive of underlying COPD. Aortic Atherosclerosis (ICD10-I70.0) and Emphysema (ICD10-J43.9). Electronically Signed   By: Vinnie Langton M.D.   On: 06/24/2022 10:19  Latest Ref Rng & Units 02/08/2021   10:05 AM  PFT Results  FVC-Pre L 2.70   FVC-Predicted Pre % 84   FVC-Post L 2.91   FVC-Predicted Post % 91   Pre FEV1/FVC % % 78   Post FEV1/FCV % % 76   FEV1-Pre L 2.10   FEV1-Predicted Pre % 84   FEV1-Post L 2.22   DLCO uncorrected ml/min/mmHg 16.08   DLCO UNC% % 83   DLCO corrected ml/min/mmHg 16.08   DLCO COR %Predicted % 83   DLVA Predicted % 81   TLC L 5.14   TLC % Predicted % 108   RV % Predicted % 133     No results found for: "NITRICOXIDE"      Assessment & Plan:   OSA (obstructive sleep apnea) Excellent control compliance on CPAP.  Patient CPAP machine is getting old and not working properly.  Order for new CPAP same settings sent to the DME  Plan Patient Instructions  Continue on Stiolto 2 puffs daily lbuterol inhaler As needed    Continue on CPAP at bedtime Work on healthy weight Do not drive aof sleepy Order for new CPAP .   Work on not smoking .    CT chest in 6 months -follow lung nodules.   Follow up in 6 months with Dr. Elsworth Soho  or Iona Stay NP      Emphysema lung [facility]) Emphysema-appears to be compensated on Stiolto.  Plan  Patient Instructions  Continue on Stiolto 2 puffs daily lbuterol inhaler As needed    Continue on CPAP at bedtime Work on healthy  weight Do not drive aof sleepy Order for new CPAP .   Work on not smoking .    CT chest in 6 months -follow lung nodules.   Follow up in 6 months with Dr. Elsworth Soho  or Guthrie Lemme NP      Tobacco use disorder Smoking cessation discussed Continue with yearly CT screening through the lung cancer screening program  Multiple lung nodules Patient has known multiple lung nodules.  Recent CT chest showed majority are stable.  Some new areas of nodular groundglass attenuation bilaterally.  Suspect is due to recent viral illness.  Will repeat CT chest in 6 months for follow-up as patient is an active smoker     Rexene Edison, NP 06/26/2022

## 2022-06-26 NOTE — Assessment & Plan Note (Signed)
Excellent control compliance on CPAP.  Patient CPAP machine is getting old and not working properly.  Order for new CPAP same settings sent to the DME  Plan Patient Instructions  Continue on Stiolto 2 puffs daily lbuterol inhaler As needed    Continue on CPAP at bedtime Work on healthy weight Do not drive aof sleepy Order for new CPAP .   Work on not smoking .    CT chest in 6 months -follow lung nodules.   Follow up in 6 months with Dr. Elsworth Soho  or Zayna Toste NP

## 2022-06-26 NOTE — Assessment & Plan Note (Signed)
Emphysema-appears to be compensated on Stiolto.  Plan  Patient Instructions  Continue on Stiolto 2 puffs daily lbuterol inhaler As needed    Continue on CPAP at bedtime Work on healthy weight Do not drive aof sleepy Order for new CPAP .   Work on not smoking .    CT chest in 6 months -follow lung nodules.   Follow up in 6 months with Dr. Elsworth Soho  or Asiah Browder NP

## 2022-06-26 NOTE — Addendum Note (Signed)
Addended by: Vanessa Barbara on: 06/26/2022 03:40 PM   Modules accepted: Orders

## 2022-07-16 ENCOUNTER — Telehealth: Payer: Self-pay | Admitting: Acute Care

## 2022-07-16 NOTE — Telephone Encounter (Signed)
I have called the patient with the results of her low dose CT Chest. Her scan was read as a lr 2, but patient had several new pulmonary nodules. Ranging in size from 18 mm to 9 mm that are most likely infectious of inflammatory. Per the patient, she did have a cold and cough at the time of the scan.My plan was to re scan the patient in 6 months to ensure resolution of the pulmonary nodules.  She was seen by Rexene Edison NP, 06/25/2022 ,who has ordered a 6 month follow up scan for the same reason .This will be due early July 2024.  Denise, please watch for the 6 month follow up scan Tammy ordered as it will not come to our dashboard. If there is resolution of the new nodules, we will return her to the annual screening population starting 12/2023.  I have also discussed the findings of CAD on her scan. She has a significant family history of cardiac disease, she is on statin therapy. She had a calcium scoring scan and an echo 04/2022, and she is followed by Dr. Julianne Handler cardiology.  Denise. If you have not already done so, please fax results to PCP. Let them know plan is for a 6 month follow up scan to ensure resolution of nodules. . Thanks so much

## 2022-07-16 NOTE — Telephone Encounter (Signed)
Results/plan faxed to PCP 

## 2022-08-11 ENCOUNTER — Other Ambulatory Visit: Payer: Self-pay | Admitting: Radiology

## 2022-08-11 DIAGNOSIS — Z1231 Encounter for screening mammogram for malignant neoplasm of breast: Secondary | ICD-10-CM

## 2022-09-29 ENCOUNTER — Ambulatory Visit
Admission: RE | Admit: 2022-09-29 | Discharge: 2022-09-29 | Disposition: A | Discharge status: Home / Self Care | Payer: 59 | Source: Ambulatory Visit | Attending: Radiology | Admitting: Radiology

## 2022-09-29 DIAGNOSIS — Z1231 Encounter for screening mammogram for malignant neoplasm of breast: Secondary | ICD-10-CM

## 2022-11-24 ENCOUNTER — Other Ambulatory Visit: Payer: 59

## 2022-12-17 ENCOUNTER — Ambulatory Visit
Admission: RE | Admit: 2022-12-17 | Discharge: 2022-12-17 | Disposition: A | Discharge status: Home / Self Care | Payer: 59 | Source: Ambulatory Visit | Attending: Adult Health | Admitting: Adult Health

## 2022-12-17 DIAGNOSIS — R918 Other nonspecific abnormal finding of lung field: Secondary | ICD-10-CM

## 2023-01-01 NOTE — Progress Notes (Signed)
Called and spoke with patient, advised of results/recommendations per Rubye Oaks NP.  She verbalized understanding.  Scheduled her for f/u with Rubye Oaks NP per her request, on 01/06/23 at 9 am, advised to arrive by 8:45 am for check in.  Nothing further needed.

## 2023-01-06 ENCOUNTER — Ambulatory Visit (INDEPENDENT_AMBULATORY_CARE_PROVIDER_SITE_OTHER): Payer: 59 | Admitting: Adult Health

## 2023-01-06 ENCOUNTER — Encounter: Payer: Self-pay | Admitting: Adult Health

## 2023-01-06 VITALS — BP 118/66 | HR 91 | Temp 98.4°F | Ht 62.0 in | Wt 247.4 lb

## 2023-01-06 DIAGNOSIS — G4733 Obstructive sleep apnea (adult) (pediatric): Secondary | ICD-10-CM | POA: Diagnosis not present

## 2023-01-06 DIAGNOSIS — R918 Other nonspecific abnormal finding of lung field: Secondary | ICD-10-CM

## 2023-01-06 DIAGNOSIS — F17200 Nicotine dependence, unspecified, uncomplicated: Secondary | ICD-10-CM

## 2023-01-06 DIAGNOSIS — J439 Emphysema, unspecified: Secondary | ICD-10-CM

## 2023-01-06 NOTE — Assessment & Plan Note (Signed)
Patient CPAP is working well shows excellent control compliance.  No changes  Plan  Patient Instructions  Continue on Stiolto 2 puffs daily Albuterol inhaler As needed    Continue on CPAP at bedtime Work on healthy weight Do not drive if sleepy   Work on not smoking .    Continue with Lung Cancer CT chest screening program.   Follow up in 6 months with Dr. Vassie Loll  or Meshulem Onorato NP and As needed

## 2023-01-06 NOTE — Progress Notes (Signed)
Follow-up at [facility] to see  @Patient  ID: Kristin Bender, female    DOB: Jun 05, 1966, 57 y.o.   MRN: [id number]  Chief Complaint  Patient presents with   Follow-up    Referring provider: Loreen Freud  HPI: 57 year old female active smoker followed for sleep apnea and emphysema  TEST/EVENTS :  Spirometry 06/2017 showed ratio of 85, FEV1 of 81% and FVC of 75% suggesting mild restriction   01/2021 pulmonary function testing that was completed today that shows normal lung function with an FEV1 at 88%, ratio 76, FVC 91%, no significant bronchodilator response.  DLCO was 83%.   Severe OSA 34/hr sleep study HST 06/2015 CPAP titration optimal control on 13cm   01/06/2023 Follow up : OSA and Emphysema  Patient return for a 90-month follow-up.  Patient has underlying severe sleep apnea on nocturnal CPAP.  Patient says she is doing very well on CPAP.  Wears it every single night.  Does not miss any nights.  Feels that she benefits from CPAP with decreased daytime sleepiness.  CPAP download shows excellent compliance with 100% usage.  Daily average usage of CPAP at 7.5 hours.  Patient is on auto CPAP 8 to 13 cm H2O.  Daily average pressure 11.6 cm H2O.  AHI 0.1/hour.  Uses a nasal mask . Got a new CPAP machine, working well.   Has underlying emphysema.  She is active smoker.  We discussed smoking cessation. She participates in LDCT chest screening .  CT chest on June 23, 2022 showed new groundglass nodularity.  Follow-up CT chest December 17, 2022 showed resolved subsolid nodules.  Other scattered pulmonary nodules are unchanged and considered stable.  We discussed that she will need to continue with yearly CT scans part of the lung cancer screening program.  She denies any unintentional weight loss or hemoptysis.  She remains on Stiolto daily.  Says overall breathing is doing well.  She denies any flare of cough or wheezing.  Allergies  Allergen Reactions   Penicillins Anaphylaxis     Has patient had a PCN reaction causing immediate rash, facial/tongue/throat swelling, SOB or lightheadedness with hypotension: Yes Has patient had a PCN reaction causing severe rash involving mucus membranes or skin necrosis: Unknown Has patient had a PCN reaction that required hospitalization: no Has patient had a PCN reaction occurring within the last 10 years: no If all of the above answers are "NO", then may proceed with Cephalosporin use.    Bee Venom Swelling   Hydrocodone-Acetaminophen     Other reaction(s): Dizziness, Vomiting   Other Swelling and Other (See Comments)    bandaids- causes pt to turn red    Prozac [Fluoxetine] Hives   Tetracyclines & Related Other (See Comments)    Severe abdominal pain   Calamine Rash   Clindamycin/Lincomycin Nausea And Vomiting    Immunization History  Administered Date(s) Administered   PFIZER(Purple Top)SARS-COV-2 Vaccination 08/27/2019, 09/19/2019, 07/13/2020    Past Medical History:  Diagnosis Date   Chronic back pain    COPD (chronic obstructive pulmonary disease) (HCC)    GERD (gastroesophageal reflux disease)    Hyperlipidemia    Hypertension    Hypothyroidism    Pre-diabetes    Sleep apnea     Tobacco History: Social History   Tobacco Use  Smoking Status Every Day   Current packs/day: 1.00   Average packs/day: 1 pack/day for 34.0 years (34.0 ttl pk-yrs)   Types: Cigarettes  Smokeless Tobacco Never  Tobacco Comments   Smoking  a pack/day, no trying to quit. 01/06/23 hfb   Ready to quit: Not Answered Counseling given: Not Answered Tobacco comments: Smoking a pack/day, no trying to quit. 01/06/23 hfb   Outpatient Medications Prior to Visit  Medication Sig Dispense Refill   ibuprofen (ADVIL) 200 MG tablet Take 200 mg by mouth every 6 (six) hours as needed.     lansoprazole (PREVACID) 15 MG capsule Take 15 mg by mouth daily.      levothyroxine (SYNTHROID) 125 MCG tablet Take 125 mcg by mouth daily before breakfast.  Takes name brand only (Synthroid)     losartan-hydrochlorothiazide (HYZAAR) 50-12.5 MG tablet Take 1 tablet by mouth daily.      nystatin ointment (MYCOSTATIN) Apply 1 Application topically 2 (two) times daily. 30 g 3   rosuvastatin (CRESTOR) 5 MG tablet Does not take often     STIOLTO RESPIMAT 2.5-2.5 MCG/ACT AERS SMARTSIG:2 Puff(s) Via Inhaler Daily     triamcinolone ointment (KENALOG) 0.1 % Apply 1 Application topically 2 (two) times daily. 30 g 3   VENTOLIN HFA 108 (90 Base) MCG/ACT inhaler INHALE 2 PUFF(S) EVERY 4 HOURS BY INHALATION ROUTE AS NEEDED FOR 30 DAYS.     metoprolol tartrate (LOPRESSOR) 100 MG tablet Take 1 tablet (100 mg total) by mouth once for 1 dose. Take 90-120 minutes prior to scan. 1 tablet 0   SPIRIVA RESPIMAT 2.5 MCG/ACT AERS INHALE 2 PUFFS BY MOUTH INTO THE LUNGS DAILY (Patient not taking: Reported on 06/26/2022) 4 g 5   valACYclovir (VALTREX) 1000 MG tablet Take 1 tablet (1,000 mg total) by mouth daily as needed (herpes). (Patient not taking: Reported on 01/06/2023) 30 tablet 6   celecoxib (CELEBREX) 100 MG capsule Take 100 mg by mouth 2 (two) times daily as needed. (Patient not taking: Reported on 01/06/2023)     No facility-administered medications prior to visit.     Review of Systems:   Constitutional:   No  weight loss, night sweats,  Fevers, chills, fatigue, or  lassitude.  HEENT:   No headaches,  Difficulty swallowing,  Tooth/dental problems, or  Sore throat,                No sneezing, itching, ear ache, nasal congestion, post nasal drip,   CV:  No chest pain,  Orthopnea, PND, swelling in lower extremities, anasarca, dizziness, palpitations, syncope.   GI  No heartburn, indigestion, abdominal pain, nausea, vomiting, diarrhea, change in bowel habits, loss of appetite, bloody stools.   Resp: No shortness of breath with exertion or at rest.  No excess mucus, no productive cough,  No non-productive cough,  No coughing up of blood.  No change in color of mucus.   No wheezing.  No chest wall deformity  Skin: no rash or lesions.  GU: no dysuria, change in color of urine, no urgency or frequency.  No flank pain, no hematuria   MS:  No joint pain or swelling.  No decreased range of motion.  No back pain.    Physical Exam  BP 118/66 (BP Location: Left Arm, Patient Position: Sitting, Cuff Size: Large)   Pulse 91   Temp 98.4 F (36.9 C) (Oral)   Ht 5\' 2"  (1.575 m)   Wt 247 lb 6.4 oz (112.2 kg)   SpO2 96%   BMI 45.25 kg/m   GEN: A/Ox3; pleasant , NAD, well nourished    HEENT:  [place]/AT,   NOSE-clear, THROAT-clear, no lesions, no postnasal drip or exudate noted.   NECK:  Supple  w/ fair ROM; no JVD; normal carotid impulses w/o bruits; no thyromegaly or nodules palpated; no lymphadenopathy.    RESP  Clear  P & A; w/o, wheezes/ rales/ or rhonchi. no accessory muscle use, no dullness to percussion  CARD:  RRR, no m/r/g, no peripheral edema, pulses intact, no cyanosis or clubbing.  GI:   Soft & nt; nml bowel sounds; no organomegaly or masses detected.   Musco: Warm bil, no deformities or joint swelling noted.   Neuro: alert, no focal deficits noted.    Skin: Warm, no lesions or rashes    Lab Results:  CBC    Component Value Date/Time   WBC 11.7 (H) 01/06/2017 1445   WBC 11.7 (H) 09/14/2009 1500   RBC 4.17 01/06/2017 1445   RBC 4.66 09/14/2009 1500   HGB 12.3 01/06/2017 1445   HCT 36.6 01/06/2017 1445   PLT 303 01/06/2017 1445   MCV 87.8 01/06/2017 1445   MCH 29.5 01/06/2017 1445   MCHC 33.6 01/06/2017 1445   MCHC 34.7 09/14/2009 1500   RDW 13.5 01/06/2017 1445   LYMPHSABS 4.5 (H) 01/06/2017 1445   MONOABS 0.8 01/06/2017 1445   EOSABS 0.4 01/06/2017 1445   BASOSABS 0.1 01/06/2017 1445    BMET    Component Value Date/Time   NA 138 04/22/2021 0845   NA 141 01/06/2017 1445   K 4.2 04/22/2021 0845   K 4.4 01/06/2017 1445   CL 101 04/22/2021 0845   CO2 24 04/22/2021 0845   CO2 27 01/06/2017 1445   GLUCOSE 94 04/22/2021 0845    GLUCOSE 87 01/06/2017 1445   BUN 9 04/22/2021 0845   BUN 7.3 01/06/2017 1445   CREATININE 0.80 04/22/2021 0845   CREATININE 0.9 01/06/2017 1445   CALCIUM 10.3 (H) 04/22/2021 0845   CALCIUM 9.9 01/06/2017 1445   GFRNONAA >60 09/14/2009 1500   GFRAA  09/14/2009 1500    >60        The eGFR has been calculated using the MDRD equation. This calculation has not been validated in all clinical situations. eGFR's persistently <60 mL/min signify possible Chronic Kidney Disease.    BNP No results found for: "BNP"  ProBNP No results found for: "PROBNP"  Imaging: CT Chest Wo Contrast  Result Date: 12/25/2022 CLINICAL DATA:  Follow up pulmonary nodules. History of stable pulmonary nodules on lung cancer screening CT. Most recent study 06/23/2022 demonstrated new slightly nodular areas of ground-glass attenuation (lung rads 2S). EXAM: CT CHEST WITHOUT CONTRAST TECHNIQUE: Multidetector CT imaging of the chest was performed following the standard protocol without IV contrast. RADIATION DOSE REDUCTION: This exam was performed according to the departmental dose-optimization program which includes automated exposure control, adjustment of the mA and/or kV according to patient size and/or use of iterative reconstruction technique. COMPARISON:  Previous low-dose lung cancer screening chest CT 06/23/2022 and 06/21/2021. FINDINGS: The study was performed as a routine noncontrast chest CT (not as a lung cancer screening examination). Cardiovascular: Atherosclerosis of the aorta, great vessels and coronary arteries again noted. The heart size is normal. There is no pericardial effusion. Mediastinum/Nodes: There are no enlarged mediastinal, hilar or axillary lymph nodes.Hilar assessment is limited by the lack of intravenous contrast, although the hilar contours appear unchanged. The thyroid gland, trachea and esophagus demonstrate no significant findings. Lungs/Pleura: No pleural effusion or pneumothorax. Mild  centrilobular and paraseptal emphysema. Scattered solid subpleural nodules bilaterally are unchanged, largest measuring 7 mm posteriorly in the right upper lobe on image 34/5 and 4 mm anteriorly  in the left upper lobe on image 36/5. The part solid nodules seen on the most recent lung cancer screening study are no longer visualized. No new or enlarging nodules are identified. Upper abdomen: No significant findings are seen in the visualized upper abdomen. There is a probable 1.6 cm cyst posteriorly in the mid right kidney on image 150/2; no specific follow-up imaging recommended. Musculoskeletal/Chest wall: There is no chest wall mass or suspicious osseous finding. Mild thoracic spondylosis. Unless specific follow-up recommendations are mentioned in the findings or impression sections, no imaging follow-up of any mentioned incidental findings is recommended. IMPRESSION: 1. The part solid nodules seen on the most recent lung cancer screening study are no longer visualized. No new or enlarging nodules are identified. 2. Stable small subpleural nodules bilaterally, likely benign based on stability. Recommend continued annual screening with low-dose chest CT without contrast in 12 months. 3. No acute chest findings. 4. Aortic Atherosclerosis (ICD10-I70.0) and Emphysema (ICD10-J43.9). Electronically Signed   By: Carey Bullocks M.D.   On: 12/25/2022 17:00    Administration History     None          Latest Ref Rng & Units 02/08/2021   10:05 AM  PFT Results  FVC-Pre L 2.70   FVC-Predicted Pre % 84   FVC-Post L 2.91   FVC-Predicted Post % 91   Pre FEV1/FVC % % 78   Post FEV1/FCV % % 76   FEV1-Pre L 2.10   FEV1-Predicted Pre % 84   FEV1-Post L 2.22   DLCO uncorrected ml/min/mmHg 16.08   DLCO UNC% % 83   DLCO corrected ml/min/mmHg 16.08   DLCO COR %Predicted % 83   DLVA Predicted % 81   TLC L 5.14   TLC % Predicted % 108   RV % Predicted % 133     No results found for:  "NITRICOXIDE"      Assessment & Plan:   Emphysema lung (HCC) Stable on current maintenance regimen.  No changes with Stiolto.  Encouraged on smoking cessation  Plan  Patient Instructions  Continue on Stiolto 2 puffs daily Albuterol inhaler As needed    Continue on CPAP at bedtime Work on healthy weight Do not drive if sleepy   Work on not smoking .    Continue with Lung Cancer CT chest screening program.   Follow up in 6 months with Dr. Vassie Loll  or Tyronne Blann NP and As needed      Multiple lung nodules Most recent CT scan showed resolved new nodularity that was seen on CT scan in January.  Scattered nodules appear stable.  Will continue with the yearly CT chest screening program.  OSA (obstructive sleep apnea) Patient CPAP is working well shows excellent control compliance.  No changes  Plan  Patient Instructions  Continue on Stiolto 2 puffs daily Albuterol inhaler As needed    Continue on CPAP at bedtime Work on healthy weight Do not drive if sleepy   Work on not smoking .    Continue with Lung Cancer CT chest screening program.   Follow up in 6 months with Dr. Vassie Loll  or Keiffer Piper NP and As needed      Tobacco use disorder Smoking cessation discussed in detail     Rubye Oaks, NP 01/06/2023

## 2023-01-06 NOTE — Assessment & Plan Note (Signed)
Most recent CT scan showed resolved new nodularity that was seen on CT scan in January.  Scattered nodules appear stable.  Will continue with the yearly CT chest screening program.

## 2023-01-06 NOTE — Assessment & Plan Note (Signed)
Smoking cessation discussed in detail 

## 2023-01-06 NOTE — Patient Instructions (Signed)
Continue on Stiolto 2 puffs daily Albuterol inhaler As needed    Continue on CPAP at bedtime Work on healthy weight Do not drive if sleepy   Work on not smoking .    Continue with Lung Cancer CT chest screening program.   Follow up in 6 months with Dr. Vassie Loll  or Tausha Milhoan NP and As needed

## 2023-01-06 NOTE — Assessment & Plan Note (Signed)
Stable on current maintenance regimen.  No changes with Stiolto.  Encouraged on smoking cessation  Plan  Patient Instructions  Continue on Stiolto 2 puffs daily Albuterol inhaler As needed    Continue on CPAP at bedtime Work on healthy weight Do not drive if sleepy   Work on not smoking .    Continue with Lung Cancer CT chest screening program.   Follow up in 6 months with Dr. Vassie Loll  or Retha Bither NP and As needed

## 2023-01-20 ENCOUNTER — Encounter: Payer: Self-pay | Admitting: Radiology

## 2023-01-20 ENCOUNTER — Ambulatory Visit (INDEPENDENT_AMBULATORY_CARE_PROVIDER_SITE_OTHER): Payer: 59 | Admitting: Radiology

## 2023-01-20 VITALS — BP 118/62 | Ht 61.75 in | Wt 248.0 lb

## 2023-01-20 DIAGNOSIS — Z01419 Encounter for gynecological examination (general) (routine) without abnormal findings: Secondary | ICD-10-CM

## 2023-01-20 DIAGNOSIS — N763 Subacute and chronic vulvitis: Secondary | ICD-10-CM

## 2023-01-20 DIAGNOSIS — B009 Herpesviral infection, unspecified: Secondary | ICD-10-CM | POA: Diagnosis not present

## 2023-01-20 DIAGNOSIS — L02211 Cutaneous abscess of abdominal wall: Secondary | ICD-10-CM

## 2023-01-20 MED ORDER — NYSTATIN 100000 UNIT/GM EX OINT: 1.0000 | TOPICAL_OINTMENT | Freq: Two times a day (BID) | CUTANEOUS | 3 refills | Status: DC

## 2023-01-20 MED ORDER — TRIAMCINOLONE ACETONIDE 0.1 % EX OINT: 1.0000 | TOPICAL_OINTMENT | Freq: Two times a day (BID) | CUTANEOUS | 3 refills | Status: DC

## 2023-01-20 MED ORDER — VALACYCLOVIR HCL 1 G PO TABS: 1000.0000 mg | ORAL_TABLET | Freq: Every day | ORAL | 6 refills | Status: DC | PRN

## 2023-01-20 MED ORDER — SULFAMETHOXAZOLE-TRIMETHOPRIM 800-160 MG PO TABS: 1.0000 | ORAL_TABLET | Freq: Two times a day (BID) | ORAL | 0 refills | Status: DC

## 2023-01-20 NOTE — Progress Notes (Signed)
Kristin Bender 1966-03-28 [id number]   History: Postmenopausal 57 y.o. presents for annual exam. C/o bump on lower abdomen she felt but cannot see.   Gynecologic History Postmenopausal Last Pap: 8/23. Results were: normal Last mammogram: 09/29/22. Results were: normal Last colonoscopy: 11/06/16 DEXA:7/22 HRT use: no  Obstetric History OB History  Gravida Para Term Preterm AB Living  2 0     2 0  SAB IAB Ectopic Multiple Live Births               # Outcome Date GA Lbr Len/2nd Weight Sex Type Anes PTL Lv  2 AB           1 AB              The following portions of the patient's history were reviewed and updated as appropriate: allergies, current medications, past family history, past medical history, past social history, past surgical history, and problem list.  Review of Systems Pertinent items noted in HPI and remainder of comprehensive ROS otherwise negative.  Past medical history, past surgical history, family history and social history were all reviewed and documented in the EPIC chart.  Exam:  Vitals:   01/20/23 1348  BP: 118/62  Weight: 248 lb (112.5 kg)  Height: 5' 1.75" (1.568 m)   Body mass index is 45.73 kg/m.  General appearance:  Normal Thyroid:  Symmetrical, normal in size, without palpable masses or nodularity. Respiratory  Auscultation:  Clear without wheezing or rhonchi Cardiovascular  Auscultation:  Regular rate, without rubs, murmurs or gallops  Edema/varicosities:  Not grossly evident Abdominal  Soft,nontender, without masses, guarding or rebound.  Liver/spleen:  No organomegaly noted  Hernia:  None appreciated  Skin  Inspection:  2cm red, warm abscess of lower left pannus fold, no exudate, tender to touch Breasts: Examined lying and sitting.   Right: Without masses, retractions, nipple discharge or axillary adenopathy.   Left: Without masses, retractions, nipple discharge or axillary adenopathy. Genitourinary   Inguinal/mons:   Normal without inguinal adenopathy  External genitalia:  Normal appearing vulva with no masses, tenderness, or lesions  BUS/Urethra/Skene's glands:  Normal  Vagina:  Normal appearing with normal color and discharge, no lesions.  Cervix:  Normal appearing without discharge or lesions  Uterus:  Normal in size, shape and contour.  Midline and mobile, nontender  Adnexa/parametria:  unable to assess due to habitus   Anus and perineum: Normal    [name], CMA present for exam  Assessment/Plan:   1. Well woman exam with routine gynecological exam Pap up to date Labs with PCP  2. Subacute vulvitis - triamcinolone ointment (KENALOG) 0.1 %; Apply 1 Application topically 2 (two) times daily.  Dispense: 30 g; Refill: 3 - nystatin ointment (MYCOSTATIN); Apply 1 Application topically 2 (two) times daily.  Dispense: 30 g; Refill: 3  3. HSV infection - valACYclovir (VALTREX) 1000 MG tablet; Take 1 tablet (1,000 mg total) by mouth daily as needed (herpes).  Dispense: 30 tablet; Refill: 6  4. Cutaneous abscess of abdominal wall Warm compresses Begin washing with panoxyl to prevent recurrent skin eruptions - sulfamethoxazole-trimethoprim (BACTRIM DS) 800-160 MG tablet; Take 1 tablet by mouth 2 (two) times daily.  Dispense: 20 tablet; Refill: 0    Discussed SBE, colonoscopy and DEXA screening as directed. Recommend of exercise weekly, including weight bearing exercise. Encouraged the use of seatbelts and sunscreen.  Return in 1 year for annual or sooner prn.  Arlie Solomons B WHNP-BC, 2:47 PM 01/20/2023

## 2023-05-26 ENCOUNTER — Encounter: Payer: Self-pay | Admitting: Adult Health

## 2023-07-02 ENCOUNTER — Other Ambulatory Visit: Payer: Self-pay | Admitting: *Deleted

## 2023-07-02 DIAGNOSIS — F17210 Nicotine dependence, cigarettes, uncomplicated: Secondary | ICD-10-CM

## 2023-07-02 DIAGNOSIS — Z87891 Personal history of nicotine dependence: Secondary | ICD-10-CM

## 2023-07-02 DIAGNOSIS — Z122 Encounter for screening for malignant neoplasm of respiratory organs: Secondary | ICD-10-CM

## 2023-07-09 ENCOUNTER — Ambulatory Visit: Payer: 59 | Admitting: Adult Health

## 2023-07-30 ENCOUNTER — Ambulatory Visit: Payer: 59 | Admitting: Adult Health

## 2023-07-30 ENCOUNTER — Encounter: Payer: Self-pay | Admitting: Adult Health

## 2023-07-30 VITALS — BP 134/80 | HR 84 | Temp 98.1°F

## 2023-07-30 DIAGNOSIS — G4733 Obstructive sleep apnea (adult) (pediatric): Secondary | ICD-10-CM

## 2023-07-30 DIAGNOSIS — J439 Emphysema, unspecified: Secondary | ICD-10-CM

## 2023-07-30 DIAGNOSIS — R918 Other nonspecific abnormal finding of lung field: Secondary | ICD-10-CM

## 2023-07-30 MED ORDER — ALBUTEROL SULFATE HFA 108 (90 BASE) MCG/ACT IN AERS: 1.0000 | INHALATION_SPRAY | Freq: Four times a day (QID) | RESPIRATORY_TRACT | 3 refills | Status: DC | PRN

## 2023-07-30 MED ORDER — STIOLTO RESPIMAT 2.5-2.5 MCG/ACT IN AERS: 2.0000 | INHALATION_SPRAY | Freq: Every day | RESPIRATORY_TRACT | 5 refills | Status: DC

## 2023-07-30 NOTE — Progress Notes (Signed)
0

## 2023-07-30 NOTE — Patient Instructions (Addendum)
Continue on Stiolto 2 puffs daily Albuterol inhaler As needed    Continue on CPAP at bedtime Work on healthy weight Do not drive if sleepy  CT chest in July as planned.  Work on not smoking .     Follow up in 6 months with Dr. Vassie Loll  or Asmi Fugere NP

## 2023-07-30 NOTE — Progress Notes (Signed)
@Patient  ID: Kristin Bender, female    DOB: 02-07-1966, 59 y.o.   MRN: [id number]  Chief Complaint  Patient presents with   Follow-up    Referring provider: Loreen Freud  HPI: 58 year old female active smoker followed for sleep apnea and emphysema  TEST/EVENTS :  Spirometry 06/2017 showed ratio of 85, FEV1 of 81% and FVC of 75% suggesting mild restriction   01/2021 pulmonary function testing that was completed today that shows normal lung function with an FEV1 at 88%, ratio 76, FVC 91%, no significant bronchodilator response.  DLCO was 83%.   Severe OSA 34/hr sleep study HST 06/2015 CPAP titration optimal control on 13cm    07/30/2023 Follow up ; OSA and Emphysema  Patient returns for 62-month follow-up.  Patient has underlying severe obstructive sleep apnea.  She remains on nocturnal CPAP.  Patient says she is doing well on CPAP.  Feels that she benefits from CPAP with decreased daytime sleepiness.  CPAP shows excellent compliance with 100% usage.  Daily average usage at 6 hours.  Patient is on auto CPAP 8 to 13 cm H2O.  AHI 0.1/hour.  She uses a nasal mask.  Got a new CPAP machine 2024.  Patient has underlying emphysema.  She remains an active smoker.  We discussed smoking cessation.  Participates in the lung cancer CT chest screening program.  CT chest December 17, 2022 showed emphysema, scattered pulmonary nodules largest measuring 7 mm.  Recommendations for a 1 year follow-up.  Remains on Stiolto daily. Travels extensively. Recently went on Cruise.   Allergies  Allergen Reactions   Penicillins Anaphylaxis    Has patient had a PCN reaction causing immediate rash, facial/tongue/throat swelling, SOB or lightheadedness with hypotension: Yes Has patient had a PCN reaction causing severe rash involving mucus membranes or skin necrosis: Unknown Has patient had a PCN reaction that required hospitalization: no Has patient had a PCN reaction occurring within the last 10 years:  no If all of the above answers are "NO", then may proceed with Cephalosporin use.    Bee Venom Swelling   Hydrocodone-Acetaminophen     Other reaction(s): Dizziness, Vomiting   Other Swelling and Other (See Comments)    bandaids- causes pt to turn red    Prozac [Fluoxetine] Hives   Tetracyclines & Related Other (See Comments)    Severe abdominal pain   Calamine Rash   Clindamycin/Lincomycin Nausea And Vomiting    Immunization History  Administered Date(s) Administered   PFIZER(Purple Top)SARS-COV-2 Vaccination 08/27/2019, 09/19/2019, 07/13/2020    Past Medical History:  Diagnosis Date   Chronic back pain    COPD (chronic obstructive pulmonary disease) (HCC)    Emphysema lung (HCC)    GERD (gastroesophageal reflux disease)    Hyperlipidemia    Hyperlipidemia    Hypertension    Hypothyroidism    Pre-diabetes    Sleep apnea     Tobacco History: Social History   Tobacco Use  Smoking Status Every Day   Current packs/day: 1.00   Average packs/day: 1 pack/day for 34.0 years (34.0 ttl pk-yrs)   Types: Cigarettes  Smokeless Tobacco Never  Tobacco Comments   Smoking a pack/day, no trying to quit. 01/06/23 hfb   Ready to quit: No Counseling given: Yes Tobacco comments: Smoking a pack/day, no trying to quit. 01/06/23 hfb   Outpatient Medications Prior to Visit  Medication Sig Dispense Refill   lansoprazole (PREVACID) 15 MG capsule Take 15 mg by mouth daily.      levothyroxine (SYNTHROID)  125 MCG tablet Take 125 mcg by mouth daily before breakfast. Takes name brand only (Synthroid)     losartan-hydrochlorothiazide (HYZAAR) 50-12.5 MG tablet Take 1 tablet by mouth daily.      metoprolol tartrate (LOPRESSOR) 100 MG tablet Take 1 tablet (100 mg total) by mouth once for 1 dose. Take 90-120 minutes prior to scan. 1 tablet 0   nystatin ointment (MYCOSTATIN) Apply 1 Application topically 2 (two) times daily. 30 g 3   rosuvastatin (CRESTOR) 5 MG tablet Does not take often      triamcinolone ointment (KENALOG) 0.1 % Apply 1 Application topically 2 (two) times daily. 30 g 3   valACYclovir (VALTREX) 1000 MG tablet Take 1 tablet (1,000 mg total) by mouth daily as needed (herpes). 30 tablet 6   ibuprofen (ADVIL) 200 MG tablet Take 200 mg by mouth every 6 (six) hours as needed.     SPIRIVA RESPIMAT 2.5 MCG/ACT AERS INHALE 2 PUFFS BY MOUTH INTO THE LUNGS DAILY (Patient not taking: Reported on 01/20/2023) 4 g 5   STIOLTO RESPIMAT 2.5-2.5 MCG/ACT AERS SMARTSIG:2 Puff(s) Via Inhaler Daily     sulfamethoxazole-trimethoprim (BACTRIM DS) 800-160 MG tablet Take 1 tablet by mouth 2 (two) times daily. 20 tablet 0   VENTOLIN HFA 108 (90 Base) MCG/ACT inhaler INHALE 2 PUFF(S) EVERY 4 HOURS BY INHALATION ROUTE AS NEEDED FOR 30 DAYS.     No facility-administered medications prior to visit.     Review of Systems:   Constitutional:   No  weight loss, night sweats,  Fevers, chills, +fatigue, or  lassitude.  HEENT:   No headaches,  Difficulty swallowing,  Tooth/dental problems, or  Sore throat,                No sneezing, itching, ear ache, nasal congestion, post nasal drip,   CV:  No chest pain,  Orthopnea, PND, swelling in lower extremities, anasarca, dizziness, palpitations, syncope.   GI  No heartburn, indigestion, abdominal pain, nausea, vomiting, diarrhea, change in bowel habits, loss of appetite, bloody stools.   Resp: No wheezing.  No chest wall deformity  Skin: no rash or lesions.  GU: no dysuria, change in color of urine, no urgency or frequency.  No flank pain, no hematuria   MS:  No joint pain or swelling.  No decreased range of motion.  No back pain.    Physical Exam  BP 134/80   Pulse 84   Temp 98.1 F (36.7 C)   SpO2 95%   GEN: A/Ox3; pleasant , NAD, well nourished    HEENT:  [place]/AT,  NOSE-clear, THROAT-clear, no lesions, no postnasal drip or exudate noted.   NECK:  Supple w/ fair ROM; no JVD; normal carotid impulses w/o bruits; no thyromegaly or nodules  palpated; no lymphadenopathy.    RESP  Clear  P & A; w/o, wheezes/ rales/ or rhonchi. no accessory muscle use, no dullness to percussion  CARD:  RRR, no m/r/g, no peripheral edema, pulses intact, no cyanosis or clubbing.  GI:   Soft & nt; nml bowel sounds; no organomegaly or masses detected.   Musco: Warm bil, no deformities or joint swelling noted.   Neuro: alert, no focal deficits noted.    Skin: Warm, no lesions or rashes    Lab Results:    BNP No results found for: "BNP"  ProBNP No results found for: "PROBNP"  Imaging: No results found.  Administration History     None  Latest Ref Rng & Units 02/08/2021   10:05 AM  PFT Results  FVC-Pre L 2.70   FVC-Predicted Pre % 84   FVC-Post L 2.91   FVC-Predicted Post % 91   Pre FEV1/FVC % % 78   Post FEV1/FCV % % 76   FEV1-Pre L 2.10   FEV1-Predicted Pre % 84   FEV1-Post L 2.22   DLCO uncorrected ml/min/mmHg 16.08   DLCO UNC% % 83   DLCO corrected ml/min/mmHg 16.08   DLCO COR %Predicted % 83   DLVA Predicted % 81   TLC L 5.14   TLC % Predicted % 108   RV % Predicted % 133     No results found for: "NITRICOXIDE"      Assessment & Plan:   OSA (obstructive sleep apnea) Excellent control compliance on nocturnal CPAP.  Patient education given  Plan  . Patient Instructions  Continue on Stiolto 2 puffs daily Albuterol inhaler As needed    Continue on CPAP at bedtime Work on healthy weight Do not drive if sleepy  CT chest in July as planned.  Work on not smoking .     Follow up in 6 months with Dr. Vassie Loll  or Cataleya Cristina NP     Multiple lung nodules Stable on CT scan July 2024.  Plans for a follow-up CT scan prior to the lung cancer CT chest screening program July 2025.  Emphysema lung (HCC) Encouraged on smoking cessation.  Continue on current regimen appears at baseline.  Plan Patient Instructions  Continue on Stiolto 2 puffs daily Albuterol inhaler As needed    Continue on CPAP at  bedtime Work on healthy weight Do not drive if sleepy  CT chest in July as planned.  Work on not smoking .     Follow up in 6 months with Dr. Vassie Loll  or Sherrelle Prochazka NP       Rubye Oaks, NP 07/30/2023

## 2023-07-30 NOTE — Assessment & Plan Note (Signed)
Excellent control compliance on nocturnal CPAP.  Patient education given  Plan  . Patient Instructions  Continue on Stiolto 2 puffs daily Albuterol inhaler As needed    Continue on CPAP at bedtime Work on healthy weight Do not drive if sleepy  CT chest in July as planned.  Work on not smoking .     Follow up in 6 months with Dr. Vassie Loll  or Fatima Fedie NP

## 2023-07-30 NOTE — Assessment & Plan Note (Signed)
Encouraged on smoking cessation.  Continue on current regimen appears at baseline.  Plan Patient Instructions  Continue on Stiolto 2 puffs daily Albuterol inhaler As needed    Continue on CPAP at bedtime Work on healthy weight Do not drive if sleepy  CT chest in July as planned.  Work on not smoking .     Follow up in 6 months with Dr. Vassie Loll  or Gilberte Gorley NP

## 2023-07-30 NOTE — Assessment & Plan Note (Signed)
Stable on CT scan July 2024.  Plans for a follow-up CT scan prior to the lung cancer CT chest screening program July 2025.

## 2023-08-24 ENCOUNTER — Other Ambulatory Visit: Payer: Self-pay | Admitting: Radiology

## 2023-08-24 DIAGNOSIS — Z1231 Encounter for screening mammogram for malignant neoplasm of breast: Secondary | ICD-10-CM

## 2023-09-30 ENCOUNTER — Ambulatory Visit
Admission: RE | Admit: 2023-09-30 | Discharge: 2023-09-30 | Disposition: A | Discharge status: Home / Self Care | Source: Ambulatory Visit | Attending: Radiology | Admitting: Radiology

## 2023-09-30 DIAGNOSIS — Z1231 Encounter for screening mammogram for malignant neoplasm of breast: Secondary | ICD-10-CM

## 2023-10-30 ENCOUNTER — Other Ambulatory Visit: Payer: Self-pay | Admitting: *Deleted

## 2023-10-30 DIAGNOSIS — M79606 Pain in leg, unspecified: Secondary | ICD-10-CM

## 2023-11-06 ENCOUNTER — Ambulatory Visit (HOSPITAL_BASED_OUTPATIENT_CLINIC_OR_DEPARTMENT_OTHER)
Admission: RE | Admit: 2023-11-06 | Discharge: 2023-11-06 | Disposition: A | Discharge status: Home / Self Care | Source: Ambulatory Visit | Attending: Vascular Surgery | Admitting: Vascular Surgery

## 2023-11-06 ENCOUNTER — Ambulatory Visit (HOSPITAL_COMMUNITY)
Admission: RE | Admit: 2023-11-06 | Discharge: 2023-11-06 | Disposition: A | Discharge status: Home / Self Care | Source: Ambulatory Visit | Attending: Vascular Surgery | Admitting: Vascular Surgery

## 2023-11-06 DIAGNOSIS — M79605 Pain in left leg: Secondary | ICD-10-CM

## 2023-11-06 DIAGNOSIS — M79606 Pain in leg, unspecified: Secondary | ICD-10-CM | POA: Diagnosis present

## 2023-11-06 LAB — VAS US ABI WITH/WO TBI
Left ABI: 1.04
Right ABI: 1.01

## 2023-11-24 ENCOUNTER — Ambulatory Visit: Attending: Vascular Surgery | Admitting: Vascular Surgery

## 2023-11-24 ENCOUNTER — Encounter: Payer: Self-pay | Admitting: Vascular Surgery

## 2023-11-24 VITALS — BP 119/65 | HR 85 | Temp 98.2°F | Resp 22 | Ht 61.75 in | Wt 241.2 lb

## 2023-11-24 DIAGNOSIS — I872 Venous insufficiency (chronic) (peripheral): Secondary | ICD-10-CM | POA: Insufficient documentation

## 2023-11-24 NOTE — Progress Notes (Signed)
 Patient name: Kristin Bender MRN: [id number] DOB: September 14, 1965 Sex: female  REASON FOR CONSULT: Varicose veins and claudication symptoms  HPI: Kristin Bender is a 58 y.o. female, with history of hypertension, hyperlipidemia, COPD that presents for evaluation of varicose veins in her bilateral lower extremities and claudication symptoms.  She states her main complaint is pain in her back as well as both calves when standing.  This has been ongoing for months.  She was referred here for evaluation of vascular disease.  Also got venous reflux studies.  When I ask her about leg swelling she states this is not a significant problem for her.  Past Medical History:  Diagnosis Date   Chronic back pain    COPD (chronic obstructive pulmonary disease) (HCC)    Emphysema lung (HCC)    GERD (gastroesophageal reflux disease)    Hyperlipidemia    Hyperlipidemia    Hypertension    Hypothyroidism    Pre-diabetes    Sleep apnea     Past Surgical History:  Procedure Laterality Date   CARPAL TUNNEL RELEASE Left    COLONOSCOPY WITH PROPOFOL  N/A 11/06/2016   Procedure: COLONOSCOPY WITH PROPOFOL ;  Surgeon: Tami Falcon, MD;  Location: [facility];  Service: Endoscopy;  Laterality: N/A;   WISDOM TOOTH EXTRACTION      Family History  Problem Relation Age of Onset   Hypertension Mother    Valvular heart disease Mother    Stroke Father        Cause of death   Coronary artery disease Brother    Heart attack Brother    Prostate cancer Brother    Prostate cancer Brother    Hypertension Maternal Grandmother    Cancer Maternal Grandfather        Prostate   Breast cancer Paternal Grandmother    Clotting disorder Paternal Grandmother        Cause of death   Heart attack Paternal Grandfather     SOCIAL HISTORY: Social History   Socioeconomic History   Marital status: Married    Spouse name: Not on file   Number of children: Not on file   Years of education: Not on file   Highest  education level: Not on file  Occupational History   Not on file  Tobacco Use   Smoking status: Every Day    Current packs/day: 1.00    Average packs/day: 1 pack/day for 34.0 years (34.0 ttl pk-yrs)    Types: Cigarettes   Smokeless tobacco: Never   Tobacco comments:    Smoking a pack/day, no trying to quit. 01/06/23 hfb  Vaping Use   Vaping status: Never Used  Substance and Sexual Activity   Alcohol use: Yes    Alcohol/week: 0.0 standard drinks of alcohol    Comment: occasional   Drug use: No   Sexual activity: Not Currently    Partners: Male    Birth control/protection: Post-menopausal    Comment: menarche 58yo, sexual debut 58yo  Other Topics Concern   Not on file  Social History Narrative   Not on file   Social Drivers of Health   Financial Resource Strain: Not on file  Food Insecurity: Not on file  Transportation Needs: Not on file  Physical Activity: Not on file  Stress: Not on file  Social Connections: Not on file  Intimate Partner Violence: Not on file    Allergies  Allergen Reactions   Penicillins Anaphylaxis    Has patient had a PCN reaction causing immediate  rash, facial/tongue/throat swelling, SOB or lightheadedness with hypotension: Yes Has patient had a PCN reaction causing severe rash involving mucus membranes or skin necrosis: Unknown Has patient had a PCN reaction that required hospitalization: no Has patient had a PCN reaction occurring within the last 10 years: no If all of the above answers are "NO", then may proceed with Cephalosporin use.    Bee Venom Swelling   Hydrocodone-Acetaminophen     Other reaction(s): Dizziness, Vomiting   Other Swelling and Other (See Comments)    bandaids- causes pt to turn red    Prozac [Fluoxetine] Hives   Tetracyclines & Related Other (See Comments)    Severe abdominal pain   Calamine Rash   Clindamycin/Lincomycin Nausea And Vomiting    Current Outpatient Medications  Medication Sig Dispense Refill    albuterol  (VENTOLIN  HFA) 108 (90 Base) MCG/ACT inhaler Inhale 1-2 puffs into the lungs every 6 (six) hours as needed for wheezing or shortness of breath. 1 each 3   lansoprazole (PREVACID) 15 MG capsule Take 15 mg by mouth daily.      levothyroxine (SYNTHROID) 125 MCG tablet Take 125 mcg by mouth daily before breakfast. Takes name brand only (Synthroid)     losartan-hydrochlorothiazide (HYZAAR) 50-12.5 MG tablet Take 1 tablet by mouth daily.      nystatin  ointment (MYCOSTATIN ) Apply 1 Application topically 2 (two) times daily. 30 g 3   rosuvastatin (CRESTOR) 5 MG tablet Does not take often     Tiotropium Bromide-Olodaterol (STIOLTO RESPIMAT ) 2.5-2.5 MCG/ACT AERS Inhale 2 puffs into the lungs daily. 1 each 5   triamcinolone  ointment (KENALOG ) 0.1 % Apply 1 Application topically 2 (two) times daily. 30 g 3   valACYclovir  (VALTREX ) 1000 MG tablet Take 1 tablet (1,000 mg total) by mouth daily as needed (herpes). 30 tablet 6   metoprolol  tartrate (LOPRESSOR ) 100 MG tablet Take 1 tablet (100 mg total) by mouth once for 1 dose. Take 90-120 minutes prior to scan. 1 tablet 0   No current facility-administered medications for this visit.    REVIEW OF SYSTEMS:  [X]  denotes positive finding, [ ]  denotes negative finding Cardiac  Comments:  Chest pain or chest pressure:    Shortness of breath upon exertion:    Short of breath when lying flat:    Irregular heart rhythm:        Vascular    Pain in calf, thigh, or hip brought on by ambulation: x   Pain in feet at night that wakes you up from your sleep:     Blood clot in your veins:    Leg swelling:         Pulmonary    Oxygen at home:    Productive cough:     Wheezing:         Neurologic    Sudden weakness in arms or legs:     Sudden numbness in arms or legs:     Sudden onset of difficulty speaking or slurred speech:    Temporary loss of vision in one eye:     Problems with dizziness:         Gastrointestinal    Blood in stool:     Vomited  blood:         Genitourinary    Burning when urinating:     Blood in urine:        Psychiatric    Major depression:         Hematologic    Bleeding problems:  Problems with blood clotting too easily:        Skin    Rashes or ulcers:        Constitutional    Fever or chills:      PHYSICAL EXAM: Vitals:   11/24/23 1156  BP: 119/65  Pulse: 85  Resp: (!) 22  Temp: 98.2 F (36.8 C)  TempSrc: Temporal  SpO2: 94%  Weight: 241 lb 3.2 oz (109.4 kg)  Height: 5' 1.75" (1.568 m)    GENERAL: The patient is a well-nourished female, in no acute distress. The vital signs are documented above. CARDIAC: There is a regular rate and rhythm.  VASCULAR:  Bilateral femoral pulses palpable Bilateral DP PT pulses palpable PULMONARY: No respiratory distress. ABDOMEN: Soft and non-tender. MUSCULOSKELETAL: There are no major deformities or cyanosis. NEUROLOGIC: No focal weakness or paresthesias are detected. SKIN: There are no ulcers or rashes noted. PSYCHIATRIC: The patient has a normal affect.  DATA:   ABI on 11/06/2023: 1.01 on the right triphasic and 1.04 on the left triphasic and both within normal range    Lower Venous Reflux Study  Patient Name:  Kristin Bender  Date of Exam:   11/06/2023 Medical Rec #: [id number]            Accession #:    [id number] Date of Birth: January 18, 1966            Patient Gender: F Patient Age:   65 years Exam Location:  [address] Procedure:      VAS US  LOWER EXTREMITY VENOUS REFLUX Referring Phys: Jimmye Moulds   --------------------------------------------------------------------------- -----   Indications: Pain, and varicosities.   Performing Technologist: Homer Lust RVT    Examination Guidelines: A complete evaluation includes B-mode imaging, spectral Doppler, color Doppler, and power Doppler as needed of all accessible portions of each vessel. Bilateral testing is considered an integral part of a complete examination.  Limited examinations for reoccurring indications may be performed as noted. The reflux portion of the exam is performed with the patient in reverse Trendelenburg. Significant venous reflux is defined as >500 ms in the superficial venous system, and >1 second in the deep venous system.    +--------------+---------+------+-----------+------------+--------+ LEFT          Reflux NoRefluxReflux TimeDiameter cmsComments                         Yes                                  +--------------+---------+------+-----------+------------+--------+ CFV                     yes   >1 second                      +--------------+---------+------+-----------+------------+--------+ FV mid        no                                             +--------------+---------+------+-----------+------------+--------+ Popliteal     no                                             +--------------+---------+------+-----------+------------+--------+  GSV at [facility]              yes    >500 ms      0.81             +--------------+---------+------+-----------+------------+--------+ GSV prox thigh          yes    >500 ms      0.95             +--------------+---------+------+-----------+------------+--------+ GSV mid thigh           yes    >500 ms      0.59             +--------------+---------+------+-----------+------------+--------+ GSV dist thigh          yes    >500 ms      0.55             +--------------+---------+------+-----------+------------+--------+ GSV at knee             yes    >500 ms      0.49             +--------------+---------+------+-----------+------------+--------+ GSV prox calf           yes    >500 ms      0.39             +--------------+---------+------+-----------+------------+--------+ GSV mid calf            yes    >500 ms      0.41              +--------------+---------+------+-----------+------------+--------+ GSV dist calf           yes    >500 ms      0.37             +--------------+---------+------+-----------+------------+--------+ SSV Pop Fossa no                            0.25             +--------------+---------+------+-----------+------------+--------+ SSV prox calf no                            0.17             +--------------+---------+------+-----------+------------+--------+ SSV mid calf            yes    >500 ms      0.19             +--------------+---------+------+-----------+------------+--------+       Summary: Left: - No evidence of deep vein thrombosis seen in the left lower extremity, from the common femoral through the popliteal veins. - No evidence of superficial venous thrombosis in the left lower extremity. - Venous reflux is noted in the left common femoral vein. - Venous reflux is noted in the left sapheno-femoral junction. - Venous reflux is noted in the left greater saphenous vein in the thigh. - Venous reflux is noted in the left greater saphenous vein in the calf. - Venous reflux is noted in the left short saphenous vein.   *See table(s) above for measurements and observations.  Electronically signed by Delaney Fearing on 11/06/2023 at 3:11:48 PM.       Assessment/Plan:  58 y.o. female, with history of hypertension, hyperlipidemia, COPD that presents for evaluation of varicose veins in her bilateral lower extremities and claudication symptoms.  I discussed that it relates to her calf  pain I think this is likely more related to her back and underlying neurogenic claudication.  Her calf pain is worse just standing and she has a low back pain associated with this.  I do think she should pursue further workup and states she is going to get another opinion from a different back surgeon.  Her pedal pulses are palpable and she has normal ABIs with no evidence of  significant arterial insufficiency.  I would not pursue further workup at this time.  I will arrange follow-up in 1 year as she does have underlying risk factors with tobacco abuse and I will have her see PA in 1 year.  She does have significant venous disease as a relates to her reflux study.  I discussed etiology for venous reflux with valvular insufficiency.  She really denies any significant symptoms associated with this including leg swelling.  I discussed conservative measures would be leg elevation with exercise weight loss and compression stockings to control her lower chronic venous insufficiency.  We can follow her chronic venous disease on follow-up in 1 year as well.   Young Hensen, MD [facility] [place] [facility] Office: [phone]

## 2023-12-04 ENCOUNTER — Ambulatory Visit: Admitting: Vascular Surgery

## 2024-01-22 ENCOUNTER — Encounter: Payer: Self-pay | Admitting: Radiology

## 2024-01-22 ENCOUNTER — Ambulatory Visit: Admitting: Radiology

## 2024-01-22 VITALS — BP 122/74 | HR 88 | Ht 62.5 in | Wt 239.0 lb

## 2024-01-22 DIAGNOSIS — Z1331 Encounter for screening for depression: Secondary | ICD-10-CM | POA: Diagnosis not present

## 2024-01-22 DIAGNOSIS — Z01419 Encounter for gynecological examination (general) (routine) without abnormal findings: Secondary | ICD-10-CM

## 2024-01-22 NOTE — Patient Instructions (Signed)
 Preventive Care 16-58 Years Old, Female  Preventive care refers to lifestyle choices and visits with your health care provider that can promote health and wellness. Preventive care visits are also called wellness exams.  What can I expect for my preventive care visit?  Counseling  Your health care provider may ask you questions about your:  Medical history, including:  Past medical problems.  Family medical history.  Pregnancy history.  Current health, including:  Menstrual cycle.  Method of birth control.  Emotional well-being.  Home life and relationship well-being.  Sexual activity and sexual health.  Lifestyle, including:  Alcohol, nicotine or tobacco, and drug use.  Access to firearms.  Diet, exercise, and sleep habits.  Work and work Astronomer.  Sunscreen use.  Safety issues such as seatbelt and bike helmet use.  Physical exam  Your health care provider will check your:  Height and weight. These may be used to calculate your BMI (body mass index). BMI is a measurement that tells if you are at a healthy weight.  Waist circumference. This measures the distance around your waistline. This measurement also tells if you are at a healthy weight and may help predict your risk of certain diseases, such as type 2 diabetes and high blood pressure.  Heart rate and blood pressure.  Body temperature.  Skin for abnormal spots.  What immunizations do I need?    Vaccines are usually given at various ages, according to a schedule. Your health care provider will recommend vaccines for you based on your age, medical history, and lifestyle or other factors, such as travel or where you work.  What tests do I need?  Screening  Your health care provider may recommend screening tests for certain conditions. This may include:  Lipid and cholesterol levels.  Diabetes screening. This is done by checking your blood sugar (glucose) after you have not eaten for a while (fasting).  Pelvic exam and Pap test.  Hepatitis B test.  Hepatitis C  test.  HIV (human immunodeficiency virus) test.  STI (sexually transmitted infection) testing, if you are at risk.  Lung cancer screening.  Colorectal cancer screening.  Mammogram. Talk with your health care provider about when you should start having regular mammograms. This may depend on whether you have a family history of breast cancer.  BRCA-related cancer screening. This may be done if you have a family history of breast, ovarian, tubal, or peritoneal cancers.  Bone density scan. This is done to screen for osteoporosis.  Talk with your health care provider about your test results, treatment options, and if necessary, the need for more tests.  Follow these instructions at home:  Eating and drinking    Eat a diet that includes fresh fruits and vegetables, whole grains, lean protein, and low-fat dairy products.  Take vitamin and mineral supplements as recommended by your health care provider.  Do not drink alcohol if:  Your health care provider tells you not to drink.  You are pregnant, may be pregnant, or are planning to become pregnant.  If you drink alcohol:  Limit how much you have to 0-1 drink a day.  Know how much alcohol is in your drink. In the [place] one drink equals one 12 oz bottle of beer (355 mL), one 5 oz glass of wine (148 mL), or one 1 oz glass of hard liquor (44 mL).  Lifestyle  Brush your teeth every morning and night with fluoride toothpaste. Floss one time each day.  Exercise for at least  30 minutes 5 or more days each week.  Do not use any products that contain nicotine or tobacco. These products include cigarettes, chewing tobacco, and vaping devices, such as e-cigarettes. If you need help quitting, ask your health care provider.  Do not use drugs.  If you are sexually active, practice safe sex. Use a condom or other form of protection to prevent STIs.  If you do not wish to become pregnant, use a form of birth control. If you plan to become pregnant, see your health care provider for a  prepregnancy visit.  Take aspirin only as told by your health care provider. Make sure that you understand how much to take and what form to take. Work with your health care provider to find out whether it is safe and beneficial for you to take aspirin daily.  Find healthy ways to manage stress, such as:  Meditation, yoga, or listening to music.  Journaling.  Talking to a trusted person.  Spending time with friends and family.  Minimize exposure to UV radiation to reduce your risk of skin cancer.  Safety  Always wear your seat belt while driving or riding in a vehicle.  Do not drive:  If you have been drinking alcohol. Do not ride with someone who has been drinking.  When you are tired or distracted.  While texting.  If you have been using any mind-altering substances or drugs.  Wear a helmet and other protective equipment during sports activities.  If you have firearms in your house, make sure you follow all gun safety procedures.  Seek help if you have been physically or sexually abused.  What's next?  Visit your health care provider once a year for an annual wellness visit.  Ask your health care provider how often you should have your eyes and teeth checked.  Stay up to date on all vaccines.  This information is not intended to replace advice given to you by your health care provider. Make sure you discuss any questions you have with your health care provider.  Document Revised: 11/28/2020 Document Reviewed: 11/28/2020  Elsevier Patient Education  2024 [employer].

## 2024-01-22 NOTE — Progress Notes (Signed)
   Kristin Bender 1966-05-02 [id number]   History: Postmenopausal 58 y.o. presents for annual exam. No gyn concerns.   Gynecologic History Postmenopausal Last Pap: 8/23. Results were: normal HPV negative Last mammogram: 09/30/23. Results were: normal Last colonoscopy: 11/06/16 DEXA:7/22 HRT use: no  Obstetric History OB History  Gravida Para Term Preterm AB Living  2 0   2 0  SAB IAB Ectopic Multiple Live Births          # Outcome Date GA Lbr Len/2nd Weight Sex Type Anes PTL Lv  2 AB           1 AB               01/22/2024    9:54 AM  Depression screen PHQ 2/9  Decreased Interest 0  Down, Depressed, Hopeless 0  PHQ - 2 Score 0     The following portions of the patient's history were reviewed and updated as appropriate: allergies, current medications, past family history, past medical history, past social history, past surgical history, and problem list.  Review of Systems Pertinent items noted in HPI and remainder of comprehensive ROS otherwise negative.  Past medical history, past surgical history, family history and social history were all reviewed and documented in the EPIC chart.  Exam:  Vitals:   01/22/24 0952  BP: 122/74  Pulse: 88  SpO2: 95%  Weight: 239 lb (108.4 kg)  Height: 5' 2.5 (1.588 m)    Body mass index is 43.02 kg/m.  General appearance:  Normal Thyroid :  Symmetrical, normal in size, without palpable masses or nodularity. Respiratory  Auscultation:  Clear without wheezing or rhonchi Cardiovascular  Auscultation:  Regular rate, without rubs, murmurs or gallops  Edema/varicosities:  Not grossly evident Abdominal  Soft,nontender, without masses, guarding or rebound.  Liver/spleen:  No organomegaly noted  Hernia:  None appreciated  Skin  Inspection: Warm and dry Breasts: Examined lying and sitting.   Right: Without masses, retractions, nipple discharge or axillary adenopathy.   Left: Without masses, retractions, nipple discharge or  axillary adenopathy. Genitourinary   Inguinal/mons:  Normal without inguinal adenopathy  External genitalia:  Normal appearing vulva with no masses, tenderness, or lesions  BUS/Urethra/Skene's glands:  Normal  Vagina:  Normal appearing with normal color and discharge, no lesions.  Cervix:  Normal appearing without discharge or lesions  Uterus:  Normal in size, shape and contour.  Midline and mobile, nontender  Adnexa/parametria:  unable to assess due to habitus   Anus and perineum: Normal    Kristin Bender, CMA present for exam  Assessment/Plan:   1. Well woman exam with routine gynecological exam (Primary) Pap 2026 Yearly mammogram Labs with PCP  2. Depression screen     Return in 1 year for annual or sooner prn.  Kristin Bender B WHNP-BC, 10:25 AM 01/22/2024

## 2024-01-28 ENCOUNTER — Ambulatory Visit: Payer: 59 | Admitting: Adult Health

## 2024-02-01 ENCOUNTER — Ambulatory Visit
Admission: RE | Admit: 2024-02-01 | Discharge: 2024-02-01 | Disposition: A | Discharge status: Home / Self Care | Source: Ambulatory Visit | Attending: Acute Care | Admitting: Acute Care

## 2024-02-01 DIAGNOSIS — Z122 Encounter for screening for malignant neoplasm of respiratory organs: Secondary | ICD-10-CM

## 2024-02-01 DIAGNOSIS — F17210 Nicotine dependence, cigarettes, uncomplicated: Secondary | ICD-10-CM

## 2024-02-01 DIAGNOSIS — Z87891 Personal history of nicotine dependence: Secondary | ICD-10-CM

## 2024-02-16 ENCOUNTER — Other Ambulatory Visit: Payer: Self-pay | Admitting: Acute Care

## 2024-02-16 DIAGNOSIS — F17210 Nicotine dependence, cigarettes, uncomplicated: Secondary | ICD-10-CM

## 2024-02-16 DIAGNOSIS — Z122 Encounter for screening for malignant neoplasm of respiratory organs: Secondary | ICD-10-CM

## 2024-02-16 DIAGNOSIS — Z87891 Personal history of nicotine dependence: Secondary | ICD-10-CM

## 2024-02-26 ENCOUNTER — Encounter: Payer: Self-pay | Admitting: Adult Health

## 2024-02-26 ENCOUNTER — Ambulatory Visit: Admitting: Adult Health

## 2024-02-26 VITALS — BP 134/80 | HR 74 | Temp 98.4°F | Resp 18 | Ht 62.0 in | Wt 241.6 lb

## 2024-02-26 DIAGNOSIS — F17210 Nicotine dependence, cigarettes, uncomplicated: Secondary | ICD-10-CM | POA: Diagnosis not present

## 2024-02-26 DIAGNOSIS — G4733 Obstructive sleep apnea (adult) (pediatric): Secondary | ICD-10-CM

## 2024-02-26 DIAGNOSIS — R918 Other nonspecific abnormal finding of lung field: Secondary | ICD-10-CM

## 2024-02-26 DIAGNOSIS — J439 Emphysema, unspecified: Secondary | ICD-10-CM | POA: Diagnosis not present

## 2024-02-26 DIAGNOSIS — Z6841 Body Mass Index (BMI) 40.0 and over, adult: Secondary | ICD-10-CM

## 2024-02-26 MED ORDER — STIOLTO RESPIMAT 2.5-2.5 MCG/ACT IN AERS: 2.0000 | INHALATION_SPRAY | Freq: Every day | RESPIRATORY_TRACT | 5 refills | Status: AC

## 2024-02-26 MED ORDER — ALBUTEROL SULFATE HFA 108 (90 BASE) MCG/ACT IN AERS: 1.0000 | INHALATION_SPRAY | Freq: Four times a day (QID) | RESPIRATORY_TRACT | 3 refills | Status: AC | PRN

## 2024-02-26 NOTE — Progress Notes (Signed)
 @Patient  ID: Kristin Bender, female    DOB: [birth year], 58 y.o.   MRN: [id number]  Chief Complaint  Patient presents with   Follow-up    OSA    Referring provider: Katina Pfeiffer, PA-C  HPI:   TEST/EVENTS :   Allergies  Allergen Reactions   Penicillins Anaphylaxis    Has patient had a PCN reaction causing immediate rash, facial/tongue/throat swelling, SOB or lightheadedness with hypotension: Yes Has patient had a PCN reaction causing severe rash involving mucus membranes or skin necrosis: Unknown Has patient had a PCN reaction that required hospitalization: no Has patient had a PCN reaction occurring within the last 10 years: no If all of the above answers are NO, then may proceed with Cephalosporin use.    Bee Venom Swelling   Hydrocodone-Acetaminophen     Other reaction(s): Dizziness, Vomiting   Other Swelling and Other (See Comments)    bandaids- causes pt to turn red    Prozac [Fluoxetine] Hives   Tetracyclines & Related Other (See Comments)    Severe abdominal pain   Calamine Rash   Clindamycin/Lincomycin Nausea And Vomiting    Immunization History  Administered Date(s) Administered   PFIZER(Purple Top)SARS-COV-2 Vaccination 08/27/2019, 09/19/2019, 07/13/2020    Past Medical History:  Diagnosis Date   Chronic back pain    COPD (chronic obstructive pulmonary disease) (HCC)    Emphysema lung (HCC)    GERD (gastroesophageal reflux disease)    Hyperlipidemia    Hyperlipidemia    Hypertension    Hypothyroidism    Pre-diabetes    Sleep apnea     Tobacco History: Social History   Tobacco Use  Smoking Status Every Day   Current packs/day: 1.00   Average packs/day: 1 pack/day for 34.0 years (34.0 ttl pk-yrs)   Types: Cigarettes  Smokeless Tobacco Never  Tobacco Comments   Smoking a pack/day, no trying to quit. 01/06/23 hfb   Ready to quit: Not Answered Counseling given: Not Answered Tobacco comments: Smoking a pack/day, no trying to quit.  01/06/23 hfb   Outpatient Medications Prior to Visit  Medication Sig Dispense Refill   Acetaminophen (TYLENOL PO) Take by mouth.     albuterol  (VENTOLIN  HFA) 108 (90 Base) MCG/ACT inhaler Inhale 1-2 puffs into the lungs every 6 (six) hours as needed for wheezing or shortness of breath. 1 each 3   Ibuprofen (MOTRIN PO) Take by mouth.     lansoprazole (PREVACID) 15 MG capsule Take 15 mg by mouth daily.      levothyroxine (SYNTHROID) 125 MCG tablet Take 125 mcg by mouth daily before breakfast. Takes name brand only (Synthroid)     losartan-hydrochlorothiazide (HYZAAR) 50-12.5 MG tablet Take 1 tablet by mouth daily.      nystatin  ointment (MYCOSTATIN ) Apply 1 Application topically 2 (two) times daily. 30 g 3   rosuvastatin (CRESTOR) 5 MG tablet Does not take often     Tiotropium Bromide-Olodaterol (STIOLTO RESPIMAT ) 2.5-2.5 MCG/ACT AERS Inhale 2 puffs into the lungs daily. 1 each 5   triamcinolone  ointment (KENALOG ) 0.1 % Apply 1 Application topically 2 (two) times daily. 30 g 3   valACYclovir  (VALTREX ) 1000 MG tablet Take 1 tablet (1,000 mg total) by mouth daily as needed (herpes). 30 tablet 6   metoprolol  tartrate (LOPRESSOR ) 100 MG tablet Take 1 tablet (100 mg total) by mouth once for 1 dose. Take 90-120 minutes prior to scan. (Patient not taking: Reported on 02/26/2024) 1 tablet 0   No facility-administered medications prior to visit.  Review of Systems:   Constitutional:   No  weight loss, night sweats,  Fevers, chills, fatigue, or  lassitude.  HEENT:   No headaches,  Difficulty swallowing,  Tooth/dental problems, or  Sore throat,                No sneezing, itching, ear ache, nasal congestion, post nasal drip,   CV:  No chest pain,  Orthopnea, PND, swelling in lower extremities, anasarca, dizziness, palpitations, syncope.   GI  No heartburn, indigestion, abdominal pain, nausea, vomiting, diarrhea, change in bowel habits, loss of appetite, bloody stools.   Resp: No shortness of  breath with exertion or at rest.  No excess mucus, no productive cough,  No non-productive cough,  No coughing up of blood.  No change in color of mucus.  No wheezing.  No chest wall deformity  Skin: no rash or lesions.  GU: no dysuria, change in color of urine, no urgency or frequency.  No flank pain, no hematuria   MS:  No joint pain or swelling.  No decreased range of motion.  No back pain.    Physical Exam  BP 134/80 (BP Location: Left Arm, Patient Position: Sitting, Cuff Size: Normal)   Pulse 74   Temp 98.4 F (36.9 C) (Oral)   Resp 18   Ht 5' 2 (1.575 m)   Wt 241 lb 9.6 oz (109.6 kg)   SpO2 96%   BMI 44.19 kg/m   GEN: A/Ox3; pleasant , NAD, well nourished    HEENT:  [place]/AT,  EACs-clear, TMs-wnl, NOSE-clear, THROAT-clear, no lesions, no postnasal drip or exudate noted.   NECK:  Supple w/ fair ROM; no JVD; normal carotid impulses w/o bruits; no thyromegaly or nodules palpated; no lymphadenopathy.    RESP  Clear  P & A; w/o, wheezes/ rales/ or rhonchi. no accessory muscle use, no dullness to percussion  CARD:  RRR, no m/r/g, no peripheral edema, pulses intact, no cyanosis or clubbing.  GI:   Soft & nt; nml bowel sounds; no organomegaly or masses detected.   Musco: Warm bil, no deformities or joint swelling noted.   Neuro: alert, no focal deficits noted.    Skin: Warm, no lesions or rashes    Lab Results:  CBC    Component Value Date/Time   WBC 11.7 (H) 01/06/2017 1445   WBC 11.7 (H) 09/14/2009 1500   RBC 4.17 01/06/2017 1445   RBC 4.66 09/14/2009 1500   HGB 12.3 01/06/2017 1445   HCT 36.6 01/06/2017 1445   PLT 303 01/06/2017 1445   MCV 87.8 01/06/2017 1445   MCH 29.5 01/06/2017 1445   MCHC 33.6 01/06/2017 1445   MCHC 34.7 09/14/2009 1500   RDW 13.5 01/06/2017 1445   LYMPHSABS 4.5 (H) 01/06/2017 1445   MONOABS 0.8 01/06/2017 1445   EOSABS 0.4 01/06/2017 1445   BASOSABS 0.1 01/06/2017 1445    BMET    Component Value Date/Time   NA 138 04/22/2021  0845   NA 141 01/06/2017 1445   K 4.2 04/22/2021 0845   K 4.4 01/06/2017 1445   CL 101 04/22/2021 0845   CO2 24 04/22/2021 0845   CO2 27 01/06/2017 1445   GLUCOSE 94 04/22/2021 0845   GLUCOSE 87 01/06/2017 1445   BUN 9 04/22/2021 0845   BUN 7.3 01/06/2017 1445   CREATININE 0.80 04/22/2021 0845   CREATININE 0.9 01/06/2017 1445   CALCIUM 10.3 (H) 04/22/2021 0845   CALCIUM 9.9 01/06/2017 1445   GFRNONAA >60 09/14/2009 1500  GFRAA  09/14/2009 1500    >60        The eGFR has been calculated using the MDRD equation. This calculation has not been validated in all clinical situations. eGFR's persistently <60 mL/min signify possible Chronic Kidney Disease.    BNP No results found for: BNP  ProBNP No results found for: PROBNP  Imaging: CT CHEST LUNG CA SCREEN LOW DOSE W/O CM Result Date: 02/14/2024 CLINICAL DATA:  Current 41 pack-year smoker. EXAM: CT CHEST WITHOUT CONTRAST LOW-DOSE FOR LUNG CANCER SCREENING TECHNIQUE: Multidetector CT imaging of the chest was performed following the standard protocol without IV contrast. RADIATION DOSE REDUCTION: This exam was performed according to the departmental dose-optimization program which includes automated exposure control, adjustment of the mA and/or kV according to patient size and/or use of iterative reconstruction technique. COMPARISON:  06/23/2022. FINDINGS: Cardiovascular: Atherosclerotic calcification of the aorta. Heart size normal. No pericardial effusion. Mediastinum/Nodes: No pathologically enlarged mediastinal or axillary lymph nodes. Hilar regions are difficult to definitively evaluate without IV contrast. Esophagus is grossly unremarkable. Lungs/Pleura: Centrilobular and paraseptal emphysema. Smoking related respiratory bronchiolitis. Calcified granulomas. Previously seen ground-glass nodules are no longer visualized. Pulmonary nodules otherwise measure 6.0 mm or less in size, as before. No new pulmonary nodules. No pleural  fluid. Airway is unremarkable. Upper Abdomen: Small low-attenuation lesions in the kidneys. No specific follow-up necessary. Visualized portions of the liver, gallbladder, adrenal glands, kidneys, spleen, pancreas, stomach and bowel are otherwise grossly unremarkable. No upper abdominal adenopathy. Musculoskeletal: Degenerative changes in the spine. IMPRESSION: 1. Lung-RADS 2, benign appearance or behavior. Continue annual screening with low-dose chest CT without contrast in 12 months. 2.  Aortic atherosclerosis (ICD10-I70.0). 3.  Emphysema (ICD10-J43.9). Electronically Signed   By: Newell Eke M.D.   On: 02/14/2024 14:49    Administration History     None          Latest Ref Rng & Units 02/08/2021   10:05 AM  PFT Results  FVC-Pre L 2.70   FVC-Predicted Pre % 84   FVC-Post L 2.91   FVC-Predicted Post % 91   Pre FEV1/FVC % % 78   Post FEV1/FCV % % 76   FEV1-Pre L 2.10   FEV1-Predicted Pre % 84   FEV1-Post L 2.22   DLCO uncorrected ml/min/mmHg 16.08   DLCO UNC% % 83   DLCO corrected ml/min/mmHg 16.08   DLCO COR %Predicted % 83   DLVA Predicted % 81   TLC L 5.14   TLC % Predicted % 108   RV % Predicted % 133     No results found for: NITRICOXIDE      Assessment & Plan:   No problem-specific Assessment & Plan notes found for this encounter.     Madelin Stank, NP 02/26/2024

## 2024-02-26 NOTE — Patient Instructions (Addendum)
 Continue on Stiolto 2 puffs daily Albuterol  inhaler As needed    Continue on CPAP at bedtime Work on healthy weight Do not drive if sleepy  Continue with yearly CT chest screening  Work on not smoking .    Try [id number]   Follow up in 6 months with Dr. Annella  or Felise Georgia NP -PFT on return (30 min slot )

## 2024-02-26 NOTE — Progress Notes (Signed)
 @Patient  ID: Kristin Bender, female    DOB: 06-09-1966, 58 y.o.   MRN: [id number]  Chief Complaint  Patient presents with   Follow-up    OSA    Referring provider: Katina Pfeiffer, DEVONNA  HPI: 58 yo female active smoker followed for Emphysema and OSA  Participates in Lung Cancer CT chest screening program   TEST/EVENTS :  Spirometry 06/2017 showed ratio of 85, FEV1 of 81% and FVC of 75% suggesting mild restriction   01/2021 pulmonary function testing that was completed today that shows normal lung function with an FEV1 at 88%, ratio 76, FVC 91%, no significant bronchodilator response.  DLCO was 83%.   Severe OSA 34/hr sleep study HST 06/2015 CPAP titration optimal control on 13cm   Declines vaccines   02/26/2024 Follow up : Emphysema and OSA  Discussed the use of AI scribe software for clinical note transcription with the patient, who gave verbal consent to proceed.  History of Present Illness Kristin Bender is a 58 year old female with sleep apnea and COPD who presents for a pulmonary follow-up.  She consistently uses her CPAP machine every night, averaging six to seven hours of use. However, there was an incident where the CPAP machine did not turn off automatically when she got up to use the bathroom, and upon returning to bed, the machine did not turn on, resulting in a night without proper CPAP use.  This seemed to be an isolated episode and typically works just fine.  She feels that she benefits from CPAP.  CPAP download shows excellent 100% compliance with daily average usage at 6 hours.   She continues to use Stiolto Respimat , taking two puffs once a day in the morning.  She feels that this works very well for her.  Despite this, she occasionally experiences shortness of breath by evening.    She participates in the lung cancer CT chest screening program.  Last CT was August 2025 that showed no suspicious nodules   She smokes more than a pack a day, having  increased her smoking since retirement. She has tried various cessation aids like gum and lozenges, which caused nausea and hiccups. She has not tried patches due to her current smoking level. She wants to quit but is uncertain about how to proceed.  We discussed several options on quitting smoking.  Her husband has Parkinson's disease, which limits her activities together. She has traveled recently, including a trip to the [employer], which he enjoyed.    Allergies  Allergen Reactions   Penicillins Anaphylaxis    Has patient had a PCN reaction causing immediate rash, facial/tongue/throat swelling, SOB or lightheadedness with hypotension: Yes Has patient had a PCN reaction causing severe rash involving mucus membranes or skin necrosis: Unknown Has patient had a PCN reaction that required hospitalization: no Has patient had a PCN reaction occurring within the last 10 years: no If all of the above answers are NO, then may proceed with Cephalosporin use.    Bee Venom Swelling   Hydrocodone-Acetaminophen     Other reaction(s): Dizziness, Vomiting   Other Swelling and Other (See Comments)    bandaids- causes pt to turn red    Prozac [Fluoxetine] Hives   Tetracyclines & Related Other (See Comments)    Severe abdominal pain   Calamine Rash   Clindamycin/Lincomycin Nausea And Vomiting    Immunization History  Administered Date(s) Administered   PFIZER(Purple Top)SARS-COV-2 Vaccination 08/27/2019, 09/19/2019, 07/13/2020    Past  Medical History:  Diagnosis Date   Chronic back pain    COPD (chronic obstructive pulmonary disease) (HCC)    Emphysema lung (HCC)    GERD (gastroesophageal reflux disease)    Hyperlipidemia    Hyperlipidemia    Hypertension    Hypothyroidism    Pre-diabetes    Sleep apnea     Tobacco History: Social History   Tobacco Use  Smoking Status Every Day   Current packs/day: 1.00   Average packs/day: 1 pack/day for 34.0 years (34.0 ttl pk-yrs)    Types: Cigarettes  Smokeless Tobacco Never  Tobacco Comments   Smoking a pack/day, no trying to quit. 01/06/23 hfb   Ready to quit: Not Answered Counseling given: Not Answered Tobacco comments: Smoking a pack/day, no trying to quit. 01/06/23 hfb   Outpatient Medications Prior to Visit  Medication Sig Dispense Refill   Acetaminophen (TYLENOL PO) Take by mouth.     albuterol  (VENTOLIN  HFA) 108 (90 Base) MCG/ACT inhaler Inhale 1-2 puffs into the lungs every 6 (six) hours as needed for wheezing or shortness of breath. 1 each 3   Ibuprofen (MOTRIN PO) Take by mouth.     lansoprazole (PREVACID) 15 MG capsule Take 15 mg by mouth daily.      levothyroxine (SYNTHROID) 125 MCG tablet Take 125 mcg by mouth daily before breakfast. Takes name brand only (Synthroid)     losartan-hydrochlorothiazide (HYZAAR) 50-12.5 MG tablet Take 1 tablet by mouth daily.      nystatin  ointment (MYCOSTATIN ) Apply 1 Application topically 2 (two) times daily. 30 g 3   rosuvastatin (CRESTOR) 5 MG tablet Does not take often     Tiotropium Bromide-Olodaterol (STIOLTO RESPIMAT ) 2.5-2.5 MCG/ACT AERS Inhale 2 puffs into the lungs daily. 1 each 5   triamcinolone  ointment (KENALOG ) 0.1 % Apply 1 Application topically 2 (two) times daily. 30 g 3   valACYclovir  (VALTREX ) 1000 MG tablet Take 1 tablet (1,000 mg total) by mouth daily as needed (herpes). 30 tablet 6   metoprolol  tartrate (LOPRESSOR ) 100 MG tablet Take 1 tablet (100 mg total) by mouth once for 1 dose. Take 90-120 minutes prior to scan. (Patient not taking: Reported on 02/26/2024) 1 tablet 0   No facility-administered medications prior to visit.     Review of Systems:   Constitutional:   No  weight loss, night sweats,  Fevers, chills, +fatigue, or  lassitude.  HEENT:   No headaches,  Difficulty swallowing,  Tooth/dental problems, or  Sore throat,                No sneezing, itching, ear ache, nasal congestion, post nasal drip,   CV:  No chest pain,  Orthopnea, PND,  swelling in lower extremities, anasarca, dizziness, palpitations, syncope.   GI  No heartburn, indigestion, abdominal pain, nausea, vomiting, diarrhea, change in bowel habits, loss of appetite, bloody stools.   Resp:   No chest wall deformity  Skin: no rash or lesions.  GU: no dysuria, change in color of urine, no urgency or frequency.  No flank pain, no hematuria   MS:  No joint pain or swelling.  No decreased range of motion.  No back pain.    Physical Exam  BP 134/80 (BP Location: Left Arm, Patient Position: Sitting, Cuff Size: Normal)   Pulse 74   Temp 98.4 F (36.9 C) (Oral)   Resp 18   Ht 5' 2 (1.575 m)   Wt 241 lb 9.6 oz (109.6 kg)   SpO2 96%   BMI  44.19 kg/m   GEN: A/Ox3; pleasant , NAD, well nourished    HEENT:  Brave/AT, NOSE-clear, THROAT-clear, no lesions, no postnasal drip or exudate noted.   NECK:  Supple w/ fair ROM; no JVD; normal carotid impulses w/o bruits; no thyromegaly or nodules palpated; no lymphadenopathy.    RESP  Clear  P & A; w/o, wheezes/ rales/ or rhonchi. no accessory muscle use, no dullness to percussion  CARD:  RRR, no m/r/g, no peripheral edema, pulses intact, no cyanosis or clubbing.  GI:   Soft & nt; nml bowel sounds; no organomegaly or masses detected.   Musco: Warm bil, no deformities or joint swelling noted.   Neuro: alert, no focal deficits noted.    Skin: Warm, no lesions or rashes    Lab Results:  CBC   BMET   BNP No results found for: BNP  ProBNP No results found for: PROBNP  Imaging: CT CHEST LUNG CA SCREEN LOW DOSE W/O CM Result Date: 02/14/2024 CLINICAL DATA:  Current 41 pack-year smoker. EXAM: CT CHEST WITHOUT CONTRAST LOW-DOSE FOR LUNG CANCER SCREENING TECHNIQUE: Multidetector CT imaging of the chest was performed following the standard protocol without IV contrast. RADIATION DOSE REDUCTION: This exam was performed according to the departmental dose-optimization program which includes automated exposure  control, adjustment of the mA and/or kV according to patient size and/or use of iterative reconstruction technique. COMPARISON:  06/23/2022. FINDINGS: Cardiovascular: Atherosclerotic calcification of the aorta. Heart size normal. No pericardial effusion. Mediastinum/Nodes: No pathologically enlarged mediastinal or axillary lymph nodes. Hilar regions are difficult to definitively evaluate without IV contrast. Esophagus is grossly unremarkable. Lungs/Pleura: Centrilobular and paraseptal emphysema. Smoking related respiratory bronchiolitis. Calcified granulomas. Previously seen ground-glass nodules are no longer visualized. Pulmonary nodules otherwise measure 6.0 mm or less in size, as before. No new pulmonary nodules. No pleural fluid. Airway is unremarkable. Upper Abdomen: Small low-attenuation lesions in the kidneys. No specific follow-up necessary. Visualized portions of the liver, gallbladder, adrenal glands, kidneys, spleen, pancreas, stomach and bowel are otherwise grossly unremarkable. No upper abdominal adenopathy. Musculoskeletal: Degenerative changes in the spine. IMPRESSION: 1. Lung-RADS 2, benign appearance or behavior. Continue annual screening with low-dose chest CT without contrast in 12 months. 2.  Aortic atherosclerosis (ICD10-I70.0). 3.  Emphysema (ICD10-J43.9). Electronically Signed   By: Newell Eke M.D.   On: 02/14/2024 14:49    Administration History     None          Latest Ref Rng & Units 02/08/2021   10:05 AM  PFT Results  FVC-Pre L 2.70   FVC-Predicted Pre % 84   FVC-Post L 2.91   FVC-Predicted Post % 91   Pre FEV1/FVC % % 78   Post FEV1/FCV % % 76   FEV1-Pre L 2.10   FEV1-Predicted Pre % 84   FEV1-Post L 2.22   DLCO uncorrected ml/min/mmHg 16.08   DLCO UNC% % 83   DLCO corrected ml/min/mmHg 16.08   DLCO COR %Predicted % 83   DLVA Predicted % 81   TLC L 5.14   TLC % Predicted % 108   RV % Predicted % 133     No results found for:  NITRICOXIDE      Assessment & Plan:   No problem-specific Assessment & Plan notes found for this encounter.  Assessment and Plan Assessment & Plan Emphysema   Emphysema   A recent yearly LDCT Chest showed no suspicious nodules, appears to be at baseline.  Continue Stiolto two puffs once daily.  PFTs on  return . Smoking cessation discussed in detail  Nicotine dependence, cigarettes   She smokes more than a pack a day, an increase since retirement. Although ambivalent about quitting, she is considering it. Previous attempts with nicotine gum and lozenges caused adverse effects like hiccups and nausea. She had some success with Chantix  but stopped due to a personal event. Advised to reduce smoking gradually and consider behavioral changes to support cessation. Chantix  is an option if she decides to quit and reduces smoking to below a pack a day. Encourage reduction of smoking by one cigarette per week. Advise against using nicotine patches until smoking is reduced to half a pack a day. Provide 1-800-QUIT-NOW for support. Encourage behavioral changes such as not smoking in the house or car.  Obstructive sleep apnea   Obstructive sleep apnea is well-managed with CPAP therapy. She reports wearing the CPAP every night for an average of six to seven hours, effectively managing her sleep apnea.  Continue CPAP therapy with regular use every night. Ensure the CPAP machine is functioning properly and address any technical issues. Use distilled water in the CPAP water chamber and renew CPAP supplies as needed.  Morbid obesity -healthy weight loss   Plan  Patient Instructions  Continue on Stiolto 2 puffs daily Albuterol  inhaler As needed    Continue on CPAP at bedtime Work on healthy weight Do not drive if sleepy  Continue with yearly CT chest screening  Work on not smoking .    Try [id number]   Follow up in 6 months with Dr. Annella  or Jalasia Eskridge NP -PFT on return (30 min slot )         Madelin Stank, NP 02/26/2024

## 2024-05-13 ENCOUNTER — Other Ambulatory Visit: Payer: Self-pay | Admitting: Radiology

## 2024-05-13 DIAGNOSIS — N763 Subacute and chronic vulvitis: Secondary | ICD-10-CM

## 2024-05-13 DIAGNOSIS — B009 Herpesviral infection, unspecified: Secondary | ICD-10-CM

## 2024-05-16 NOTE — Telephone Encounter (Signed)
 Med refill request: Valtrex  , Kenalog  , Nystatin   Last AEX: 01/22/24  Next AEX:not scheduled  Last MMG (if hormonal med) Refill authorized: Please advise  Valtrex  - last rx 01/20/23 #20 with 6 refills.  Kenalog  - 01/20/23 30g with 3 refills Nystatin  01/20/23 #30 g with 3 refills

## 2024-05-17 ENCOUNTER — Telehealth: Payer: Self-pay

## 2024-05-17 DIAGNOSIS — B009 Herpesviral infection, unspecified: Secondary | ICD-10-CM

## 2024-05-17 NOTE — Telephone Encounter (Signed)
 Error: Please disregard  Pt called inquiring about her Valtrex  refill. Pt was informed Dr. Glennon already sent it in for her. No further action needed.

## 2024-05-19 ENCOUNTER — Other Ambulatory Visit: Payer: Self-pay | Admitting: Radiology

## 2024-05-19 ENCOUNTER — Other Ambulatory Visit: Payer: Self-pay | Admitting: Obstetrics and Gynecology

## 2024-05-19 DIAGNOSIS — B009 Herpesviral infection, unspecified: Secondary | ICD-10-CM

## 2024-05-19 MED ORDER — VALACYCLOVIR HCL 1 G PO TABS: 1000.0000 mg | ORAL_TABLET | Freq: Two times a day (BID) | ORAL | 2 refills | Status: AC

## 2024-05-19 NOTE — Telephone Encounter (Signed)
 Med refill request: Valtrex   Last AEX: 01/22/24 Next AEX: not scheduled  Last MMG (if hormonal med) Refill authorized: last rx 05/17/24 #30 with 5 refills. Patient came into office stating pharmacy says they dont have her prescription, Spoke with pharmacy, pharmacy states they never received the order for prescription that was sent on 05/17/24 and states they need new prescription. Please advise.

## 2024-06-28 ENCOUNTER — Encounter: Payer: Self-pay | Admitting: Podiatry

## 2024-06-28 ENCOUNTER — Ambulatory Visit: Admitting: Podiatry

## 2024-06-28 DIAGNOSIS — M7742 Metatarsalgia, left foot: Secondary | ICD-10-CM

## 2024-06-28 DIAGNOSIS — M7741 Metatarsalgia, right foot: Secondary | ICD-10-CM

## 2024-06-28 DIAGNOSIS — R234 Changes in skin texture: Secondary | ICD-10-CM | POA: Diagnosis not present

## 2024-06-28 NOTE — Progress Notes (Signed)
 "  Subjective:  Patient ID: Kristin Bender, female    DOB: 06-08-1966,  MRN: [id number] HPI Chief Complaint  Patient presents with   Skin Problem    Plantar/lateral heels bilateral - cracked, callus skin x months, tender walking   New Patient (Initial Visit)    Est pt 2020    59 y.o. female presents with the above complaint.   ROS: Denies fever chills nausea muscle aches pains calf pain back pain chest pain shortness of breath.  Admits wearing slides or bare feet in the house.  Past Medical History:  Diagnosis Date   Chronic back pain    COPD (chronic obstructive pulmonary disease) (HCC)    Emphysema lung (HCC)    GERD (gastroesophageal reflux disease)    Hyperlipidemia    Hyperlipidemia    Hypertension    Hypothyroidism    Pre-diabetes    Sleep apnea    Past Surgical History:  Procedure Laterality Date   CARPAL TUNNEL RELEASE Left    COLONOSCOPY WITH PROPOFOL  N/A 11/06/2016   Procedure: COLONOSCOPY WITH PROPOFOL ;  Surgeon: Kristie Lamprey, MD;  Location: [facility];  Service: Endoscopy;  Laterality: N/A;   WISDOM TOOTH EXTRACTION     Current Medications[1]  Allergies[2] Review of Systems Objective:  There were no vitals filed for this visit.  General: Well developed, nourished, in no acute distress, alert and oriented x3   Dermatological: Skin is warm, dry and supple bilateral. Nails x 10 are well maintained; remaining integument appears unremarkable at this time. There are no open sores, no preulcerative lesions, no rash or signs of infection present.  Dry fissured skin posterior lateral aspect of the left heel.  Does not open to bleeding.  No cellulitic process.  Vascular: Dorsalis Pedis artery and Posterior Tibial artery pedal pulses are 2/4 bilateral with immedate capillary fill time. Pedal hair growth present. No varicosities and no lower extremity edema present bilateral.   Neruologic: Grossly intact via light touch bilateral. Vibratory intact via tuning  fork bilateral. Protective threshold with Semmes Wienstein monofilament intact to all pedal sites bilateral. Patellar and Achilles deep tendon reflexes 2+ bilateral. No Babinski or clonus noted bilateral.   Musculoskeletal: No gross boney pedal deformities bilateral. No pain, crepitus, or limitation noted with foot and ankle range of motion bilateral. Muscular strength 5/5 in all groups tested bilateral.  Gait: Unassisted, Nonantalgic.    Radiographs:  None taken  Assessment & Plan:   Assessment: Heel fissures bilateral  Plan: O'Keefe's under occlusion x 2 weeks.  Follow-up with me if this fails to render asymptomatic     Windie Marasco T. Blessing Ozga, DPM    [1]  Current Outpatient Medications:    rosuvastatin (CRESTOR) 10 MG tablet, Take 10 mg by mouth daily., Disp: , Rfl:    Acetaminophen (TYLENOL PO), Take by mouth., Disp: , Rfl:    albuterol  (VENTOLIN  HFA) 108 (90 Base) MCG/ACT inhaler, Inhale 1-2 puffs into the lungs every 6 (six) hours as needed for wheezing or shortness of breath., Disp: 1 each, Rfl: 3   Ibuprofen (MOTRIN PO), Take by mouth., Disp: , Rfl:    lansoprazole (PREVACID) 15 MG capsule, Take 15 mg by mouth daily. , Disp: , Rfl:    levothyroxine (SYNTHROID) 125 MCG tablet, Take 125 mcg by mouth daily before breakfast. Takes name brand only (Synthroid), Disp: , Rfl:    losartan-hydrochlorothiazide (HYZAAR) 50-12.5 MG tablet, Take 1 tablet by mouth daily. , Disp: , Rfl:    nystatin  ointment (MYCOSTATIN ), APPLY TO AFFECTED  AREA TWICE A DAY, Disp: 30 g, Rfl: 2   Tiotropium Bromide-Olodaterol (STIOLTO RESPIMAT ) 2.5-2.5 MCG/ACT AERS, Inhale 2 puffs into the lungs daily., Disp: 1 each, Rfl: 5   triamcinolone  ointment (KENALOG ) 0.1 %, APPLY TO AFFECTED AREA TWICE A DAY, Disp: 30 g, Rfl: 2   valACYclovir  (VALTREX ) 1000 MG tablet, TAKE 1 TABLET (1,000 MG TOTAL) BY MOUTH DAILY AS NEEDED (HERPES)., Disp: 30 tablet, Rfl: 5 [2]  Allergies Allergen Reactions   Penicillins Anaphylaxis    Has  patient had a PCN reaction causing immediate rash, facial/tongue/throat swelling, SOB or lightheadedness with hypotension: Yes Has patient had a PCN reaction causing severe rash involving mucus membranes or skin necrosis: Unknown Has patient had a PCN reaction that required hospitalization: no Has patient had a PCN reaction occurring within the last 10 years: no If all of the above answers are NO, then may proceed with Cephalosporin use.    Bee Venom Swelling   Hydrocodone-Acetaminophen     Other reaction(s): Dizziness, Vomiting   Other Swelling and Other (See Comments)    bandaids- causes pt to turn red    Prozac [Fluoxetine] Hives   Tetracyclines & Related Other (See Comments)    Severe abdominal pain   Calamine Rash   Clindamycin/Lincomycin Nausea And Vomiting   "
